# Patient Record
Sex: Female | Born: 1970 | Race: White | Hispanic: No | Marital: Married | Smoking: Never smoker
Health system: Southern US, Community
[De-identification: ages and names within clinical notes are randomized; demographics above are authoritative.]

## PROBLEM LIST (undated history)

## (undated) DIAGNOSIS — F419 Anxiety disorder, unspecified: Secondary | ICD-10-CM

## (undated) DIAGNOSIS — F32A Depression, unspecified: Secondary | ICD-10-CM

## (undated) DIAGNOSIS — G43909 Migraine, unspecified, not intractable, without status migrainosus: Secondary | ICD-10-CM

## (undated) DIAGNOSIS — F329 Major depressive disorder, single episode, unspecified: Secondary | ICD-10-CM

## (undated) DIAGNOSIS — G43109 Migraine with aura, not intractable, without status migrainosus: Secondary | ICD-10-CM

## (undated) HISTORY — DX: Anxiety disorder, unspecified: F41.9

## (undated) HISTORY — DX: Migraine with aura, not intractable, without status migrainosus: G43.109

## (undated) HISTORY — DX: Migraine, unspecified, not intractable, without status migrainosus: G43.909

## (undated) HISTORY — DX: Major depressive disorder, single episode, unspecified: F32.9

## (undated) HISTORY — DX: Depression, unspecified: F32.A

---

## 2005-08-25 ENCOUNTER — Inpatient Hospital Stay: Payer: Self-pay | Admitting: Obstetrics and Gynecology

## 2013-08-02 ENCOUNTER — Emergency Department: Payer: Self-pay | Admitting: Emergency Medicine

## 2013-08-02 LAB — CBC WITH DIFFERENTIAL/PLATELET
BASOS ABS: 0 10*3/uL (ref 0.0–0.1)
Basophil %: 0.2 %
EOS ABS: 0.1 10*3/uL (ref 0.0–0.7)
EOS PCT: 1 %
HCT: 42.8 % (ref 35.0–47.0)
HGB: 14.5 g/dL (ref 12.0–16.0)
Lymphocyte #: 1.1 10*3/uL (ref 1.0–3.6)
Lymphocyte %: 9.6 %
MCH: 29.8 pg (ref 26.0–34.0)
MCHC: 33.9 g/dL (ref 32.0–36.0)
MCV: 88 fL (ref 80–100)
MONO ABS: 0.4 x10 3/mm (ref 0.2–0.9)
Monocyte %: 3.3 %
NEUTROS ABS: 10 10*3/uL — AB (ref 1.4–6.5)
NEUTROS PCT: 85.9 %
PLATELETS: 236 10*3/uL (ref 150–440)
RBC: 4.87 10*6/uL (ref 3.80–5.20)
RDW: 13 % (ref 11.5–14.5)
WBC: 11.6 10*3/uL — AB (ref 3.6–11.0)

## 2013-08-02 LAB — URINALYSIS, COMPLETE
Bacteria: NONE SEEN
Bilirubin,UR: NEGATIVE
Blood: NEGATIVE
Glucose,UR: NEGATIVE mg/dL (ref 0–75)
LEUKOCYTE ESTERASE: NEGATIVE
NITRITE: NEGATIVE
PH: 7 (ref 4.5–8.0)
Protein: NEGATIVE
RBC,UR: 1 /HPF (ref 0–5)
Specific Gravity: 1.012 (ref 1.003–1.030)
Squamous Epithelial: 1
WBC UR: 2 /HPF (ref 0–5)

## 2013-08-02 LAB — COMPREHENSIVE METABOLIC PANEL
Albumin: 3.6 g/dL (ref 3.4–5.0)
Alkaline Phosphatase: 47 U/L
Anion Gap: 6 — ABNORMAL LOW (ref 7–16)
BUN: 15 mg/dL (ref 7–18)
Bilirubin,Total: 0.3 mg/dL (ref 0.2–1.0)
CHLORIDE: 106 mmol/L (ref 98–107)
CREATININE: 0.91 mg/dL (ref 0.60–1.30)
Calcium, Total: 8.5 mg/dL (ref 8.5–10.1)
Co2: 27 mmol/L (ref 21–32)
EGFR (African American): >60
EGFR (Non-African Amer.): >60
Glucose: 89 mg/dL (ref 65–99)
Osmolality: 278 (ref 275–301)
Potassium: 3.8 mmol/L (ref 3.5–5.1)
SGOT(AST): 30 U/L (ref 15–37)
SGPT (ALT): 19 U/L (ref 12–78)
Sodium: 139 mmol/L (ref 136–145)
TOTAL PROTEIN: 7.2 g/dL (ref 6.4–8.2)

## 2013-08-02 LAB — TROPONIN I: Troponin-I: <0.02 ng/mL

## 2013-08-02 LAB — CK TOTAL AND CKMB (NOT AT ARMC)
CK, Total: 112 U/L
CK-MB: 1 ng/mL (ref 0.5–3.6)

## 2013-08-02 LAB — LIPASE, BLOOD: Lipase: 169 U/L (ref 73–393)

## 2014-12-30 DIAGNOSIS — G43909 Migraine, unspecified, not intractable, without status migrainosus: Secondary | ICD-10-CM | POA: Insufficient documentation

## 2014-12-30 DIAGNOSIS — G43109 Migraine with aura, not intractable, without status migrainosus: Secondary | ICD-10-CM

## 2014-12-31 ENCOUNTER — Ambulatory Visit (INDEPENDENT_AMBULATORY_CARE_PROVIDER_SITE_OTHER): Payer: BC Managed Care – PPO | Admitting: Family Medicine

## 2014-12-31 ENCOUNTER — Encounter: Payer: Self-pay | Admitting: Family Medicine

## 2014-12-31 VITALS — BP 115/75 | HR 98 | Temp 99.0°F | Ht 64.2 in | Wt 144.0 lb

## 2014-12-31 DIAGNOSIS — G43109 Migraine with aura, not intractable, without status migrainosus: Secondary | ICD-10-CM | POA: Diagnosis not present

## 2014-12-31 DIAGNOSIS — R059 Cough, unspecified: Secondary | ICD-10-CM

## 2014-12-31 DIAGNOSIS — R05 Cough: Secondary | ICD-10-CM | POA: Diagnosis not present

## 2014-12-31 MED ORDER — SUMATRIPTAN SUCCINATE 100 MG PO TABS: 100.0000 mg | ORAL_TABLET | ORAL | Status: DC | PRN

## 2014-12-31 MED ORDER — BENZONATATE 200 MG PO CAPS: 200.0000 mg | ORAL_CAPSULE | Freq: Three times a day (TID) | ORAL | Status: DC | PRN

## 2014-12-31 MED ORDER — METAXALONE 800 MG PO TABS: 800.0000 mg | ORAL_TABLET | Freq: Three times a day (TID) | ORAL | Status: DC

## 2014-12-31 NOTE — Assessment & Plan Note (Signed)
Only once every couple of months. Does not need preventative medicine. Refill of imitrex given. Skelaxin for tension headaches. Call if not getting better or getting worse. Will return in a couple of months for a PE.

## 2014-12-31 NOTE — Progress Notes (Signed)
BP 115/75 mmHg  Pulse 98  Temp(Src) 99 F (37.2 C)  Ht 5' 4.2" (1.631 m)  Wt 144 lb (65.318 kg)  BMI 24.55 kg/m2  SpO2 99%  LMP 12/10/2014 (Approximate)   Subjective:    Patient ID: Jacqueline Salinas, female    DOB: [birth year], 44 y.o.   MRN: [id number]  HPI: Jacqueline Salinas is a 44 y.o. female  Chief Complaint  Patient presents with  . Migraine  . Cough    Patient would like something for a cough   MIGRAINES Duration: chronic 1 every 2 months.  Onset: sudden Severity: severe Quality: sharp Frequency: occasional Location: forehead and behind her eyes, also has tension headaches that usually do well with muscle relaxers Headache duration: day or 2 Radiation: no Time of day headache occurs: at random Alleviating factors:  Aggravating factors:  Headache status at time of visit: asymptomatic Treatments attempted: Treatments attempted: rest, ice, heat, APAP, ibuprofen, aleve", excedrine and triptans   Aura: yes Nausea:  yes Vomiting: yes Photophobia:  yes Phonophobia:  yes Effect on social functioning:  yes Confusion:  no Gait disturbance/ataxia:  no Behavioral changes:  no Fevers:  no   UPPER RESPIRATORY TRACT INFECTION Worst symptom: cough Fever: no Cough: yes Shortness of breath: no Wheezing: no Chest pain: no Chest tightness: no Chest congestion: no Nasal congestion: no Runny nose: yes Post nasal drip: yes Sneezing: no Sore throat: yes Swollen glands: no Sinus pressure: no Headache: no Face pain: no Toothache: no Ear pain: no  Ear pressure: yes  Eyes red/itching:no Eye drainage/crusting: no  Vomiting: no Rash: no Fatigue: yes Sick contacts: yes Strep contacts: no  Context: stable Recurrent sinusitis: no Relief with OTC cold/cough medications: no  Treatments attempted: cough syrup   Relevant past medical, surgical, family and social history reviewed and updated as indicated. Interim medical history since our last visit reviewed. Allergies and  medications reviewed and updated.  Review of Systems  Constitutional: Negative.   HENT: Negative.   Respiratory: Negative.   Cardiovascular: Negative.   Psychiatric/Behavioral: Negative.     Per HPI unless specifically indicated above     Objective:    BP 115/75 mmHg  Pulse 98  Temp(Src) 99 F (37.2 C)  Ht 5' 4.2" (1.631 m)  Wt 144 lb (65.318 kg)  BMI 24.55 kg/m2  SpO2 99%  LMP 12/10/2014 (Approximate)  Wt Readings from Last 3 Encounters:  12/31/14 144 lb (65.318 kg)  10/27/12 141 lb (63.957 kg)    Physical Exam  Constitutional: She is oriented to person, place, and time. She appears well-developed and well-nourished. No distress.  HENT:  Head: Normocephalic and atraumatic.  Right Ear: Hearing and external ear normal.  Left Ear: Hearing and external ear normal.  Nose: Nose normal.  Mouth/Throat: Oropharynx is clear and moist. No oropharyngeal exudate.  Eyes: Conjunctivae, EOM and lids are normal. Pupils are equal, round, and reactive to light. Right eye exhibits no discharge. Left eye exhibits no discharge. No scleral icterus.  Neck: Normal range of motion. Neck supple. No JVD present. No tracheal deviation present. No thyromegaly present.  Cardiovascular: Normal rate, regular rhythm, normal heart sounds and intact distal pulses.  Exam reveals no gallop and no friction rub.   No murmur heard. Pulmonary/Chest: Effort normal and breath sounds normal. No stridor. No respiratory distress. She has no wheezes. She has no rales. She exhibits no tenderness.  Musculoskeletal: Normal range of motion.  Lymphadenopathy:    She has no cervical adenopathy.  Neurological:  She is alert and oriented to person, place, and time.  Skin: Skin is warm, dry and intact. No rash noted. She is not diaphoretic. No erythema. No pallor.  Psychiatric: She has a normal mood and affect. Her speech is normal and behavior is normal. Judgment and thought content normal. Cognition and memory are normal.   Nursing note and vitals reviewed.   Results for orders placed or performed in visit on 08/02/13  CBC with Differential/Platelet  Result Value Ref Range   WBC 11.6 (H) 3.6-11.0 x10 3/mm 3   RBC 4.87 3.80-5.20 X10 6/mm 3   HGB 14.5 12.0-16.0 g/dL   HCT 42.8 35.0-47.0 %   MCV 88 80-100 fL   MCH 29.8 26.0-34.0 pg   MCHC 33.9 32.0-36.0 g/dL   RDW 13.0 11.5-14.5 %   Platelet 236 150-440 x10 3/mm 3   Neutrophil % 85.9 %   Lymphocyte % 9.6 %   Monocyte % 3.3 %   Eosinophil % 1.0 %   Basophil % 0.2 %   Neutrophil # 10.0 (H) 1.4-6.5 x10 3/mm 3   Lymphocyte # 1.1 1.0-3.6 x10 3/mm 3   Monocyte # 0.4 0.2-0.9 x10 3/mm    Eosinophil # 0.1 0.0-0.7 x10 3/mm 3   Basophil # 0.0 0.0-0.1 x10 3/mm 3  Comprehensive metabolic panel  Result Value Ref Range   Glucose 89 65-99 mg/dL   BUN 15 7-18 mg/dL   Creatinine 0.91 0.60-1.30 mg/dL   Sodium 139 136-145 mmol/L   Potassium 3.8 3.5-5.1 mmol/L   Chloride 106 98-107 mmol/L   Co2 27 21-32 mmol/L   Calcium, Total 8.5 8.5-10.1 mg/dL   SGOT(AST) 30 15-37 Unit/L   SGPT (ALT) 19 12-78 U/L   Alkaline Phosphatase 47 Unit/L   Albumin 3.6 3.4-5.0 g/dL   Total Protein 7.2 6.4-8.2 g/dL   Bilirubin,Total 0.3 0.2-1.0 mg/dL   Osmolality 278 275-301   Anion Gap 6 (L) 7-16   EGFR (African American) >60    EGFR (Non-African Amer.) >60   Lipase, blood  Result Value Ref Range   Lipase 169 73-393 Unit/L  Troponin I  Result Value Ref Range   Troponin-I < 0.02 ng/mL  CK total and CKMB (cardiac)  Result Value Ref Range   CK, Total 112 Unit/L   CK-MB 1.0 0.5-3.6 ng/mL  Urinalysis, Complete  Result Value Ref Range   Color - urine Yellow    Clarity - urine Clear    Glucose,UR Negative 0-75 mg/dL   Bilirubin,UR Negative NEGATIVE   Ketone Trace NEGATIVE   Specific Gravity 1.012 1.003-1.030   Blood Negative NEGATIVE   Ph 7.0 4.5-8.0   Protein Negative NEGATIVE   Nitrite Negative NEGATIVE   Leukocyte Esterase Negative NEGATIVE   RBC,UR 1 /HPF 0-5 /HPF    WBC UR 2 /HPF 0-5 /HPF   Bacteria NONE SEEN NONE SEEN   Squamous Epithelial 1 /HPF    Mucous PRESENT       Assessment & Plan:   Problem List Items Addressed This Visit      Cardiovascular and Mediastinum   Migraine - Primary    Only once every couple of months. Does not need preventative medicine. Refill of imitrex given. Skelaxin for tension headaches. Call if not getting better or getting worse. Will return in a couple of months for a PE.       Relevant Medications   clonazePAM (KLONOPIN) 0.5 MG tablet   buPROPion (WELLBUTRIN SR) 150 MG 12 hr tablet   SUMAtriptan (IMITREX) 100 MG tablet  metaxalone (SKELAXIN) 800 MG tablet    Other Visit Diagnoses    Cough        Likely post viral. Will treat with tessalon perles. Call if not getting better or getting worse.         Follow up plan: Return after the 1st of the year for physical.

## 2015-03-25 ENCOUNTER — Encounter: Payer: BC Managed Care – PPO | Admitting: Family Medicine

## 2015-11-06 IMAGING — CT CT ABD-PELV W/ CM
2 of 5 series · 16 of 46 positions shown, 18 images · IV contrast (agent unspecified)
Comparison: None.

CLINICAL DATA: Nauseated, vomiting

EXAM:
CT ABDOMEN AND PELVIS WITH CONTRAST
TECHNIQUE: Multidetector CT imaging of the abdomen and pelvis was performed
using the standard protocol following bolus administration of
intravenous contrast.
CONTRAST:  100 mL [id number]

[Series 2: routine abd pel with · axial · 0.62mm/px · z∈[-956,-560]mm · 13 of 89 slices shown, 15 images]
[im 5/89  soft-tissue]
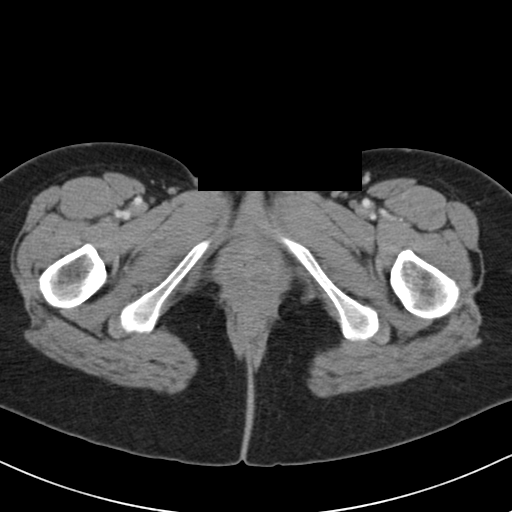
[im 5/89  bone]
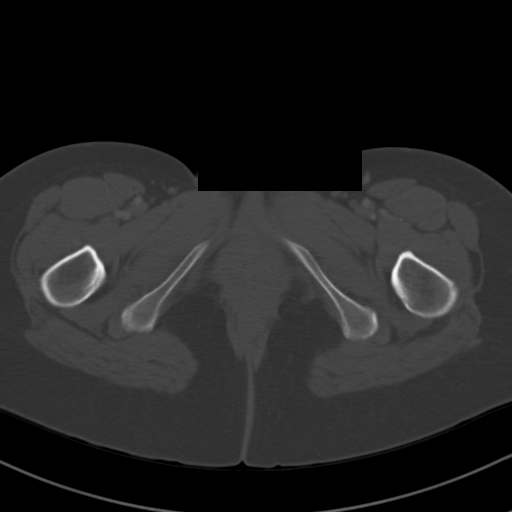
[im 10/89  soft-tissue]
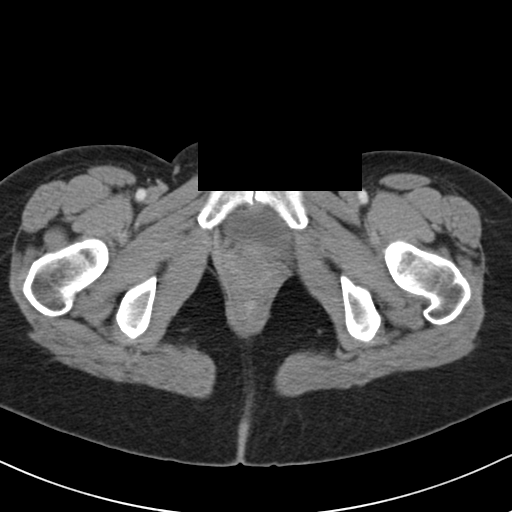
[im 20/89  soft-tissue]
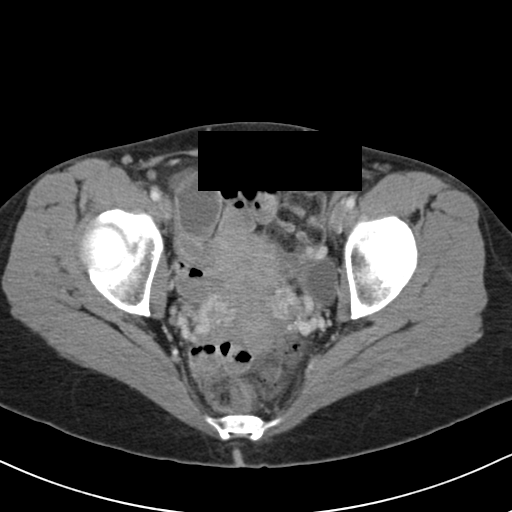
[im 25/89  soft-tissue]
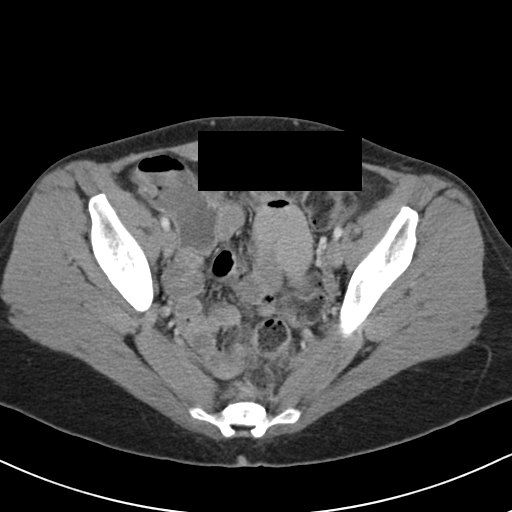
[im 30/89  soft-tissue]
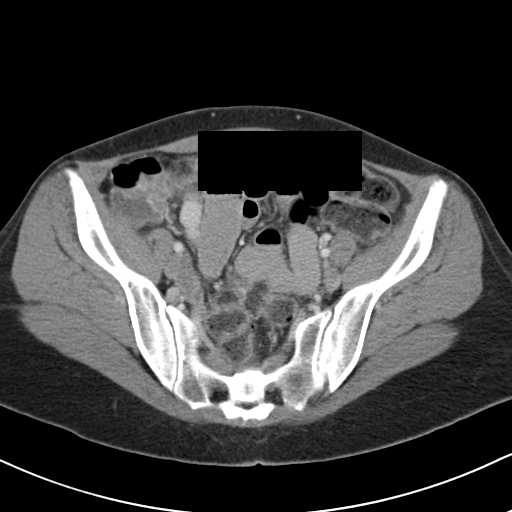
[im 40/89  soft-tissue]
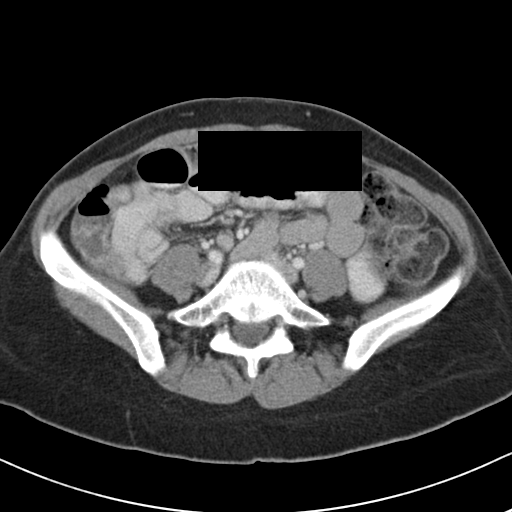
[im 45/89  soft-tissue]
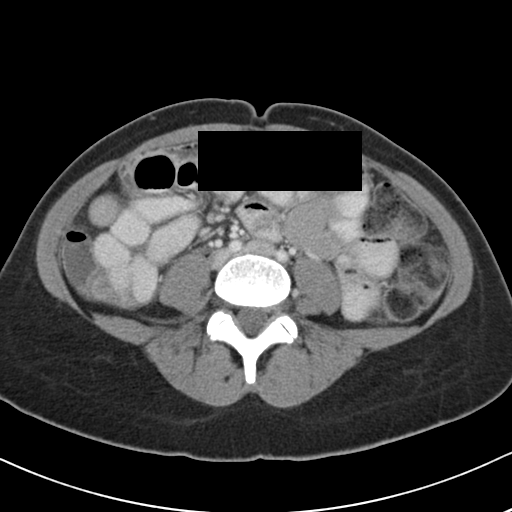
[im 49/89  soft-tissue]
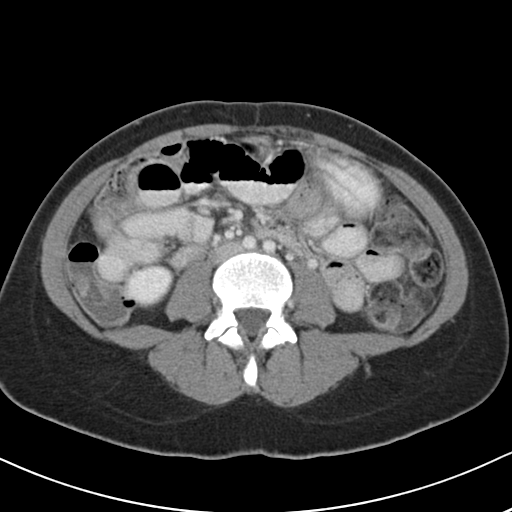
[im 59/89  soft-tissue]
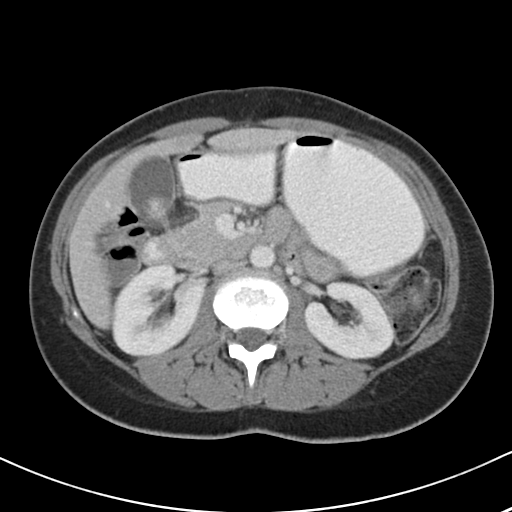
[im 59/89  bone]
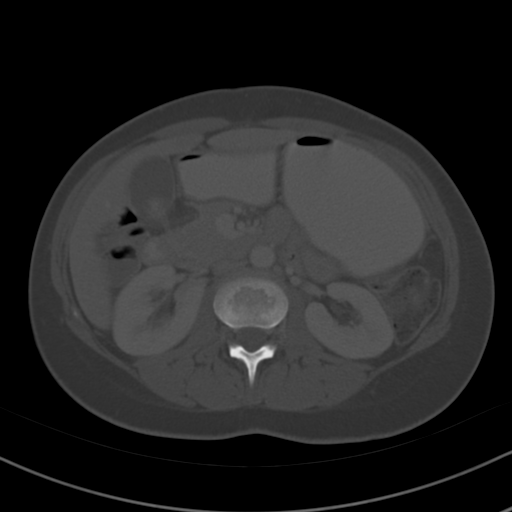
[im 64/89  soft-tissue]
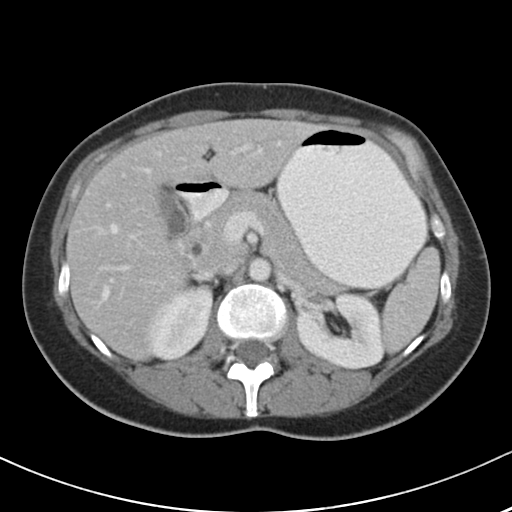
[im 69/89  soft-tissue]
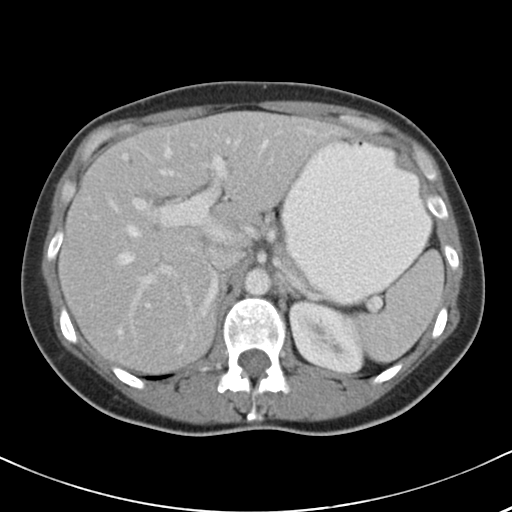
[im 79/89  soft-tissue]
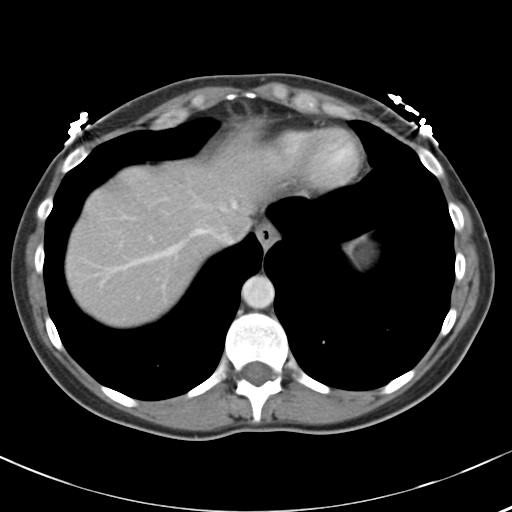
[im 84/89  soft-tissue]
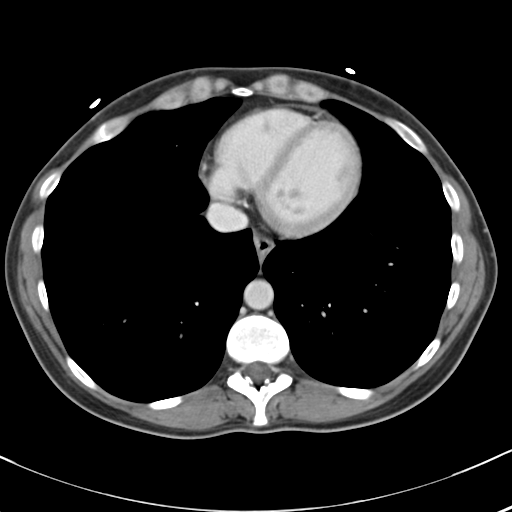

[Series 5: cor routine abd pel with · coronal · 0.65mm/px · 3 of 119 slices shown]
[im 40/119  soft-tissue]
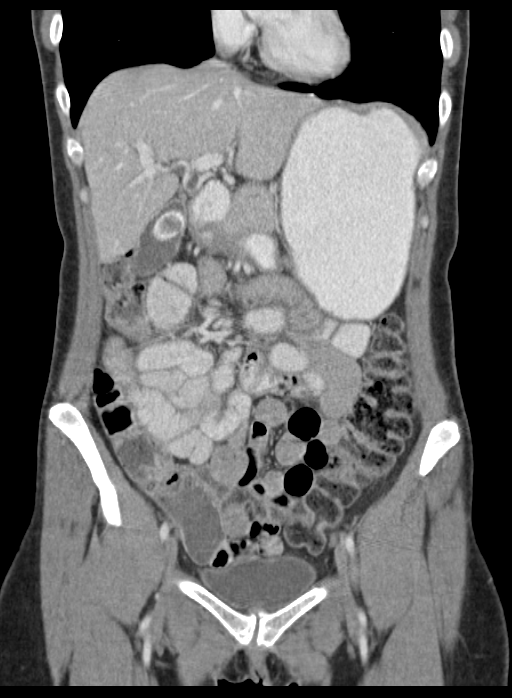
[im 53/119  soft-tissue]
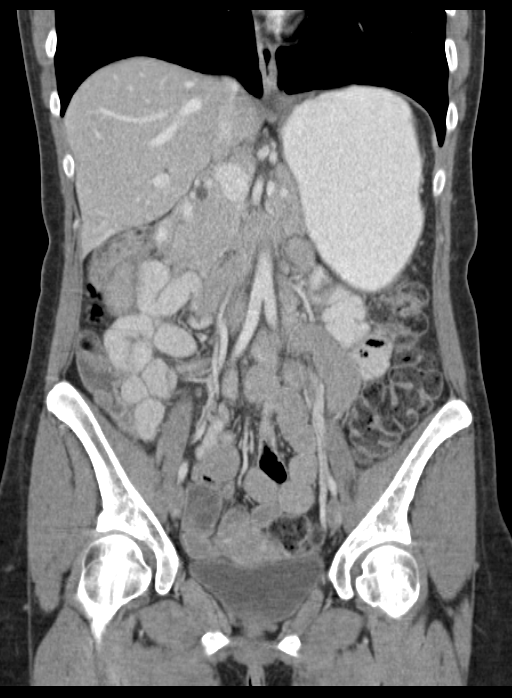
[im 66/119  soft-tissue]
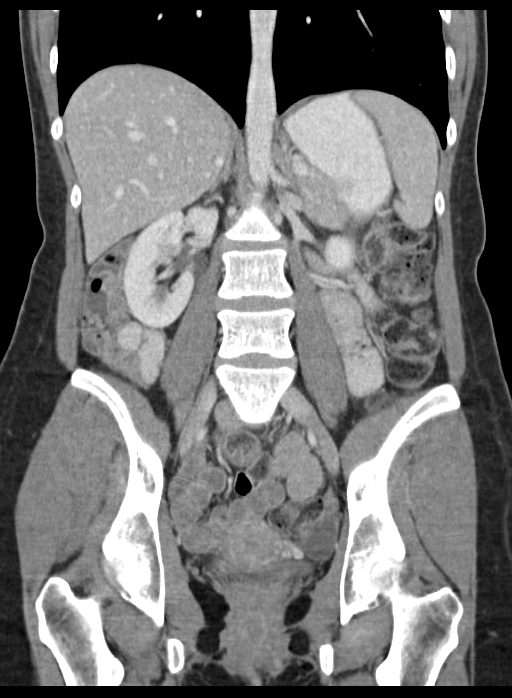

[16 of 46 positions shown; findings below may reference images not displayed]

FINDINGS: The lung bases are clear.

The liver demonstrates no focal abnormality. There is no
intrahepatic or extrahepatic biliary ductal dilatation. Large
gallstone in the gallbladder. The spleen demonstrates no focal
abnormality. The kidneys, adrenal glands and pancreas are normal.
The bladder is unremarkable.

The majority of the contrast is present within the stomach. The
stomach, duodenum, small intestine, and large intestine demonstrate
no contrast extravasation or dilatation. There is no
pneumoperitoneum, pneumatosis, or portal venous gas. There is no
abdominal or pelvic free fluid. There is no lymphadenopathy.

The abdominal aorta is normal in caliber .

There are no lytic or sclerotic osseous lesions.
IMPRESSION: 1. Large gallstone in the gallbladder.
2. A majority contrast is present within the stomach with multiple
air-fluid levels in the stomach and small bowel. There is no bowel
dilatation to suggest obstruction. This appearance can be seen with
gastroenteritis.

## 2015-11-06 IMAGING — CT CT HEAD WITHOUT CONTRAST
1 series · 16 of 30 positions shown, 20 images · non-contrast
Comparison: None.

CLINICAL DATA: Nausea.  Found on floor.  Confusion.

EXAM:
CT HEAD WITHOUT CONTRAST
TECHNIQUE: Contiguous axial images were obtained from the base of the skull
through the vertex without intravenous contrast.

[Series 2: head wo · axial · 0.41mm/px · z∈[-87,+39]mm · 16 of 32 slices shown, 20 images]
[im 2/32  brain]
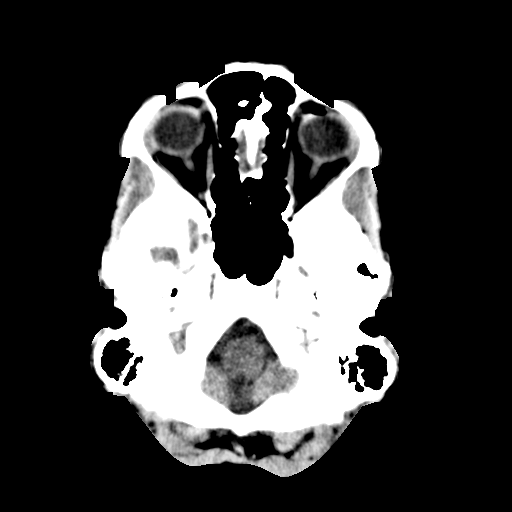
[im 2/32  bone]
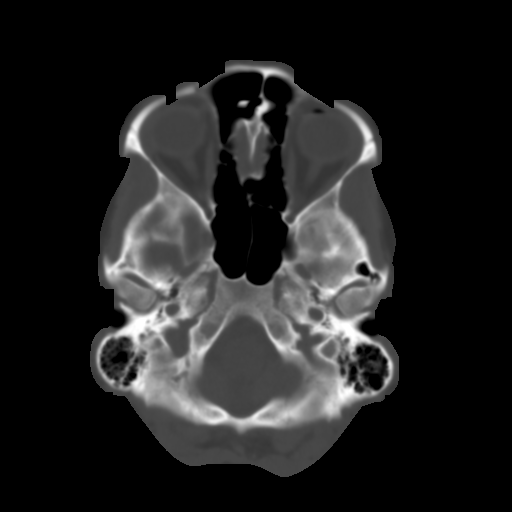
[im 4/32  brain]
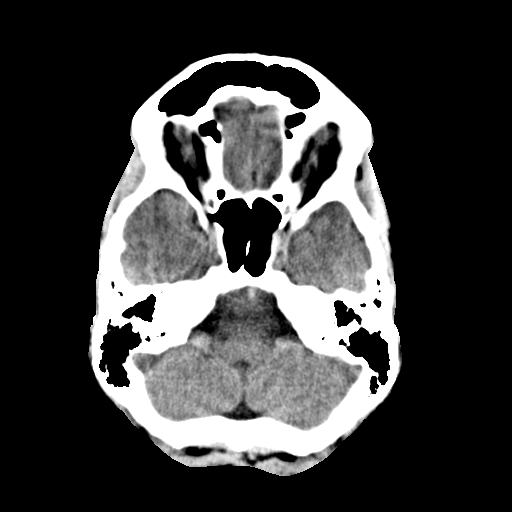
[im 6/32  brain]
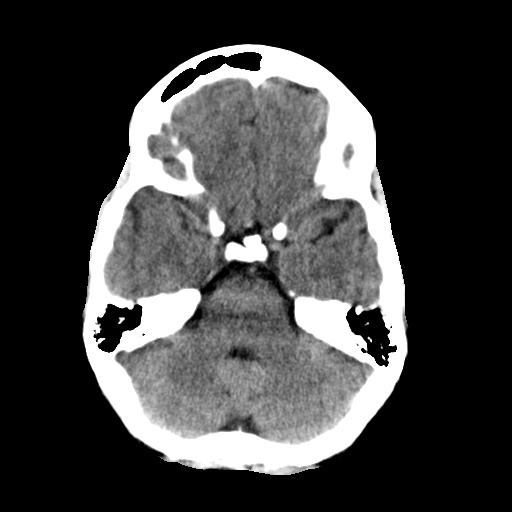
[im 8/32  brain]
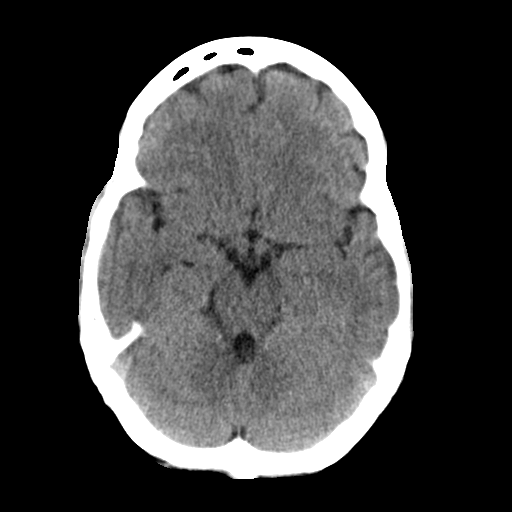
[im 9/32  brain]
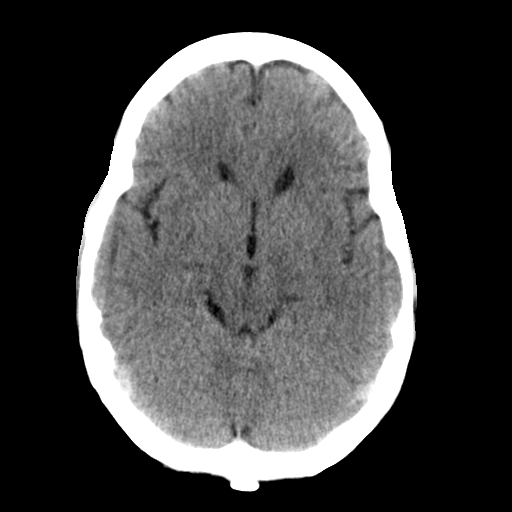
[im 9/32  bone]
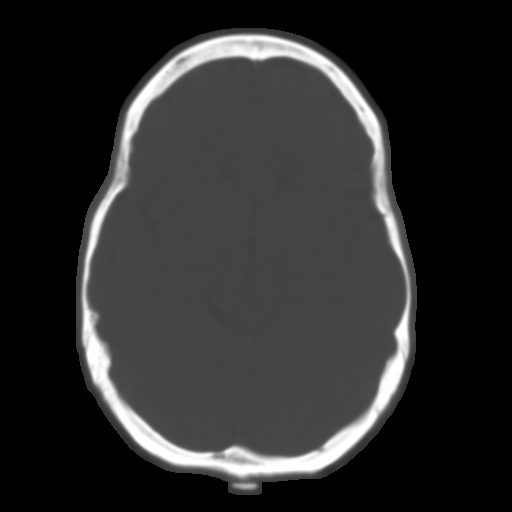
[im 11/32  brain]
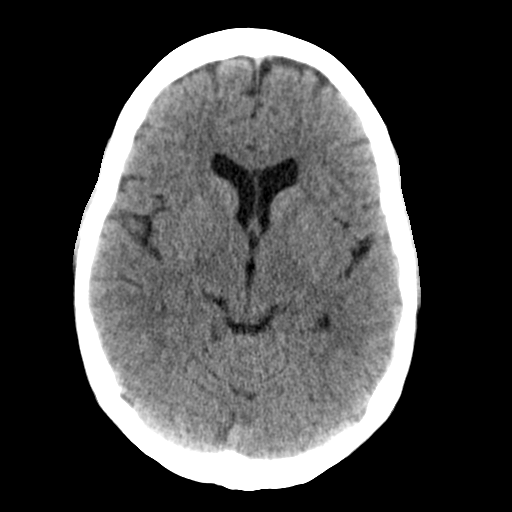
[im 13/32  brain]
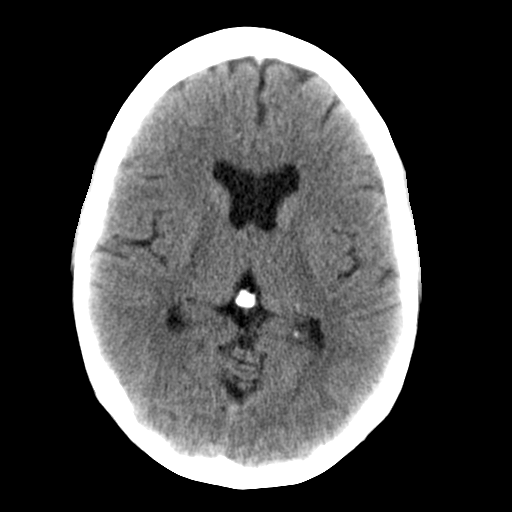
[im 15/32  brain]
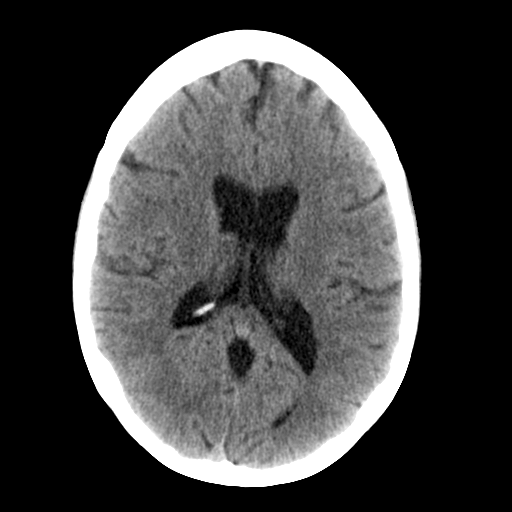
[im 17/32  brain]
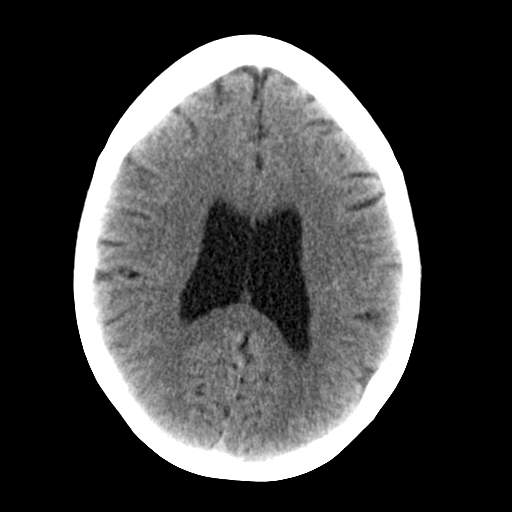
[im 17/32  bone]
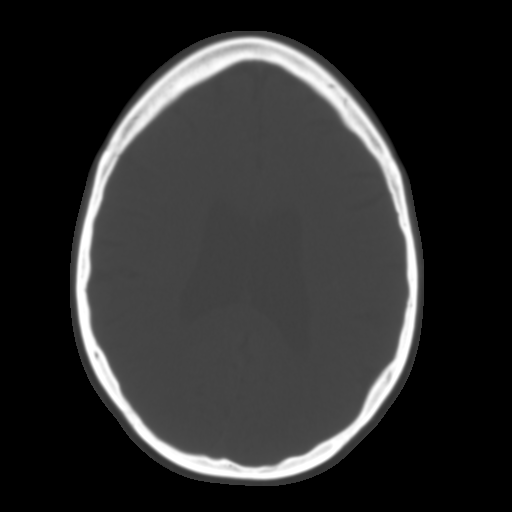
[im 19/32  brain]
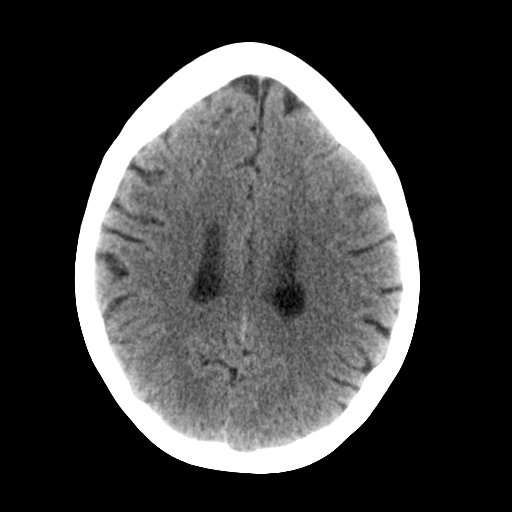
[im 21/32  brain]
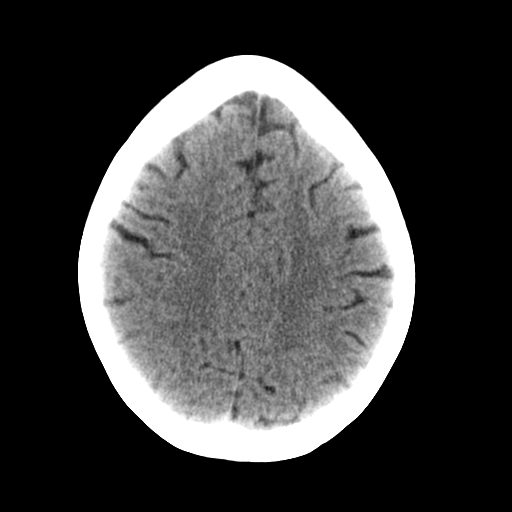
[im 23/32  brain]
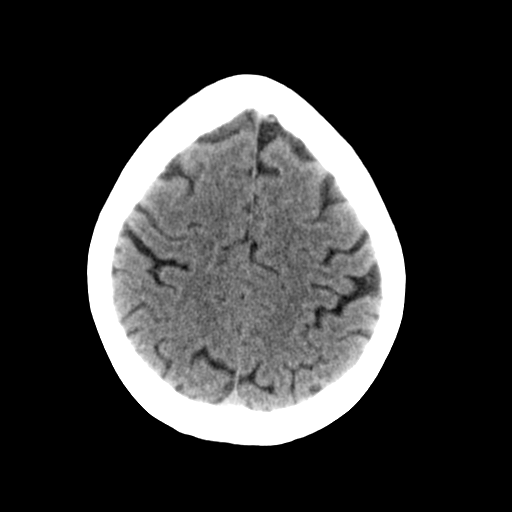
[im 24/32  brain]
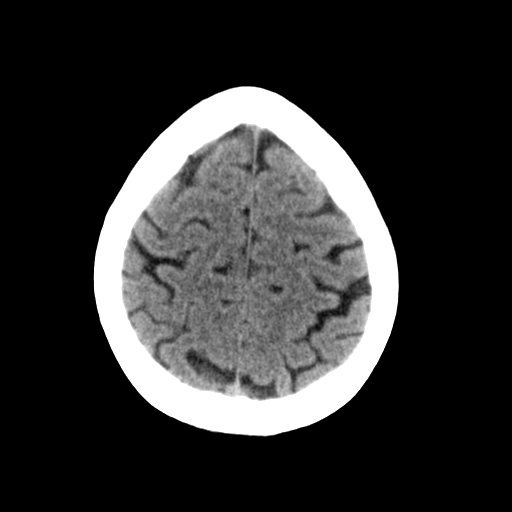
[im 24/32  bone]
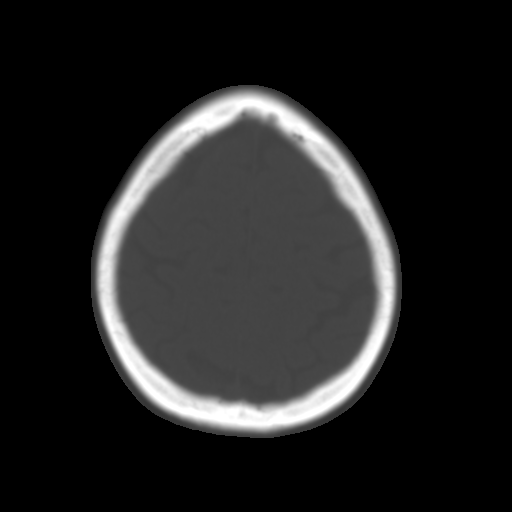
[im 26/32  brain]
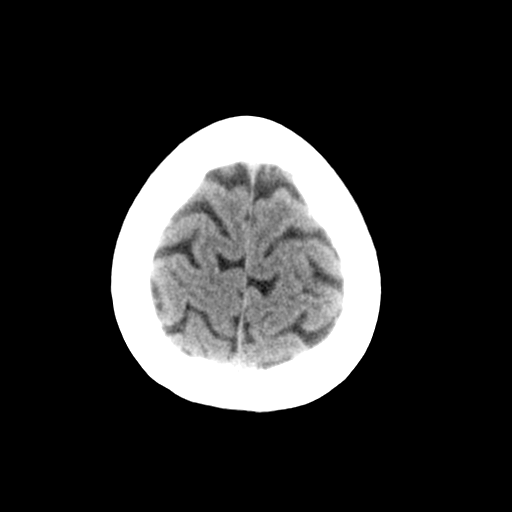
[im 28/32  brain]
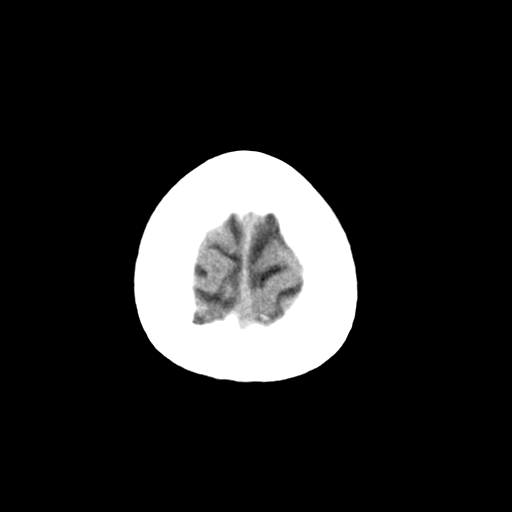
[im 30/32  brain]
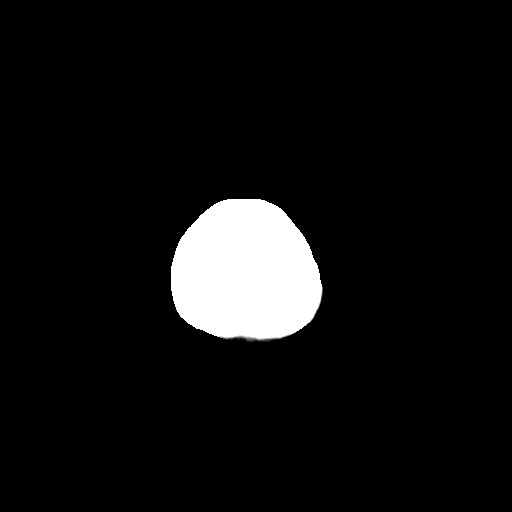

[16 of 30 positions shown; findings below may reference images not displayed]

FINDINGS: There is no evidence of acute infarction, mass lesion, or intra- or
extra-axial hemorrhage on CT.

The posterior fossa, including the cerebellum, brainstem and fourth
ventricle, is within normal limits. The third and lateral
ventricles, and basal ganglia are unremarkable in appearance. The
cerebral hemispheres are symmetric in appearance, with normal
gray-white differentiation. No mass effect or midline shift is seen.

There is no evidence of fracture; visualized osseous structures are
unremarkable in appearance. The visualized portions of the orbits
are within normal limits. The paranasal sinuses and mastoid air
cells are well-aerated. No significant soft tissue abnormalities are
seen.
IMPRESSION: No evidence of traumatic intracranial injury or fracture.

## 2017-12-22 ENCOUNTER — Ambulatory Visit (INDEPENDENT_AMBULATORY_CARE_PROVIDER_SITE_OTHER): Payer: BC Managed Care – PPO | Admitting: Family Medicine

## 2017-12-22 ENCOUNTER — Encounter: Payer: Self-pay | Admitting: Family Medicine

## 2017-12-22 VITALS — BP 122/76 | HR 93 | Temp 98.1°F | Ht 64.3 in | Wt 130.4 lb

## 2017-12-22 DIAGNOSIS — Z0000 Encounter for general adult medical examination without abnormal findings: Secondary | ICD-10-CM

## 2017-12-22 DIAGNOSIS — Z23 Encounter for immunization: Secondary | ICD-10-CM

## 2017-12-22 LAB — UA/M W/RFLX CULTURE, ROUTINE
Bilirubin, UA: NEGATIVE
Glucose, UA: NEGATIVE
Ketones, UA: NEGATIVE
Leukocytes, UA: NEGATIVE
NITRITE UA: NEGATIVE
PH UA: 6 (ref 5.0–7.5)
Protein, UA: NEGATIVE
RBC, UA: NEGATIVE
Specific Gravity, UA: <1.005 — ABNORMAL LOW (ref 1.005–1.030)
Urobilinogen, Ur: 0.2 mg/dL (ref 0.2–1.0)

## 2017-12-22 NOTE — Progress Notes (Signed)
BP 122/76 (BP Location: Left Arm, Cuff Size: Normal)   Pulse 93   Temp 98.1 F (36.7 C)   Ht 5' 4.3" (1.633 m)   Wt 130 lb 6 oz (59.1 kg)   SpO2 100%   BMI 22.17 kg/m    Subjective:    Patient ID: Jacqueline Salinas, female    DOB: [birth year], 47 y.o.   MRN: [id number]  HPI: Jacqueline Salinas is a 47 y.o. female presenting on 12/22/2017 for comprehensive medical examination. Current medical complaints include:none  Menopausal Symptoms: no  Depression Screen done today and results listed below:  Depression screen [facility] 2/9 12/22/2017  Decreased Interest 1  Down, Depressed, Hopeless 1  PHQ - 2 Score 2  Altered sleeping 0  Tired, decreased energy 2  Change in appetite 0  Feeling bad or failure about yourself  0  Trouble concentrating 0  Moving slowly or fidgety/restless 0  Suicidal thoughts 0  PHQ-9 Score 4  Difficult doing work/chores Not difficult at all     Past Medical History:  Past Medical History:  Diagnosis Date  . Anxiety   . Depression   . Migraine with acute onset aura   . Migraines     Surgical History:  History reviewed. No pertinent surgical history.  Medications:  Current Outpatient Medications on File Prior to Visit  Medication Sig  . amphetamine-dextroamphetamine (ADDERALL XR) 30 MG 24 hr capsule TK ONE C PO QAM  . amphetamine-dextroamphetamine (ADDERALL) 10 MG tablet TK 1 T PO QPM  . buPROPion (WELLBUTRIN SR) 150 MG 12 hr tablet TK 2 TS PO QAM  . clonazePAM (KLONOPIN) 0.5 MG tablet TK SS T PO BID PRN  . drospirenone-ethinyl estradiol (YAZ,GIANVI,LORYNA) 3-0.02 MG tablet Take 1 tablet by mouth daily.  . Vitamin D, Ergocalciferol, (DRISDOL) 50000 units CAPS capsule TK 1 C PO TWICE A MONTH   No current facility-administered medications on file prior to visit.     Allergies:  No Known Allergies  Social History:  Social History   Socioeconomic History  . Marital status: Married    Spouse name: Not on file  . Number of children: Not on file  .  Years of education: Not on file  . Highest education level: Not on file  Occupational History  . Not on file  Social Needs  . Financial resource strain: Not on file  . Food insecurity:    Worry: Not on file    Inability: Not on file  . Transportation needs:    Medical: Not on file    Non-medical: Not on file  Tobacco Use  . Smoking status: Never Smoker  . Smokeless tobacco: Never Used  Substance and Sexual Activity  . Alcohol use: Yes    Alcohol/week: 0.0 standard drinks    Comment: Rare  . Drug use: No  . Sexual activity: Yes    Birth control/protection: Pill  Lifestyle  . Physical activity:    Days per week: Not on file    Minutes per session: Not on file  . Stress: Not on file  Relationships  . Social connections:    Talks on phone: Not on file    Gets together: Not on file    Attends religious service: Not on file    Active member of club or organization: Not on file    Attends meetings of clubs or organizations: Not on file    Relationship status: Not on file  . Intimate partner violence:    Fear of  current or ex partner: Not on file    Emotionally abused: Not on file    Physically abused: Not on file    Forced sexual activity: Not on file  Other Topics Concern  . Not on file  Social History Narrative  . Not on file   Social History   Tobacco Use  Smoking Status Never Smoker  Smokeless Tobacco Never Used   Social History   Substance and Sexual Activity  Alcohol Use Yes  . Alcohol/week: 0.0 standard drinks   Comment: Rare    Family History:  Family History  Problem Relation Age of Onset  . Migraines Mother   . Dementia Maternal Grandmother   . Lung disease Maternal Grandfather   . Alzheimer's disease Paternal Grandfather     Past medical history, surgical history, medications, allergies, family history and social history reviewed with patient today and changes made to appropriate areas of the chart.   Review of Systems  Constitutional:  Positive for malaise/fatigue. Negative for chills, diaphoresis, fever and weight loss.  HENT: Negative.   Eyes: Negative.   Respiratory: Negative.   Cardiovascular: Negative.   Gastrointestinal: Negative.   Genitourinary: Negative.   Musculoskeletal: Negative.   Skin: Negative.   Neurological: Negative.   Endo/Heme/Allergies: Negative.   Psychiatric/Behavioral: Negative.     All other ROS negative except what is listed above and in the HPI.      Objective:    BP 122/76 (BP Location: Left Arm, Cuff Size: Normal)   Pulse 93   Temp 98.1 F (36.7 C)   Ht 5' 4.3" (1.633 m)   Wt 130 lb 6 oz (59.1 kg)   SpO2 100%   BMI 22.17 kg/m   Wt Readings from Last 3 Encounters:  12/22/17 130 lb 6 oz (59.1 kg)  12/31/14 144 lb (65.3 kg)  10/27/12 141 lb (64 kg)    Physical Exam  Constitutional: She is oriented to person, place, and time. She appears well-developed and well-nourished. No distress.  HENT:  Head: Normocephalic and atraumatic.  Right Ear: Hearing, tympanic membrane, external ear and ear canal normal.  Left Ear: Hearing, tympanic membrane, external ear and ear canal normal.  Nose: Nose normal.  Mouth/Throat: Uvula is midline, oropharynx is clear and moist and mucous membranes are normal. No oropharyngeal exudate.  Eyes: Pupils are equal, round, and reactive to light. Conjunctivae, EOM and lids are normal. Right eye exhibits no discharge. Left eye exhibits no discharge. No scleral icterus.  Neck: Normal range of motion. Neck supple. No JVD present. No tracheal deviation present. No thyromegaly present.  Cardiovascular: Normal rate, regular rhythm, normal heart sounds and intact distal pulses. Exam reveals no gallop and no friction rub.  No murmur heard. Pulmonary/Chest: Effort normal and breath sounds normal. No stridor. No respiratory distress. She has no wheezes. She has no rales. She exhibits no tenderness.  Abdominal: Soft. Bowel sounds are normal. She exhibits no distension  and no mass. There is no tenderness. There is no rebound and no guarding. No hernia.  Genitourinary:  Genitourinary Comments: Breast and pelvic exams deferred- done at GYN  Musculoskeletal: Normal range of motion. She exhibits no edema, tenderness or deformity.  Lymphadenopathy:    She has no cervical adenopathy.  Neurological: She is alert and oriented to person, place, and time. She displays normal reflexes. No cranial nerve deficit or sensory deficit. She exhibits normal muscle tone. Coordination normal.  Skin: Skin is warm, dry and intact. Capillary refill takes less than 2 seconds.  No rash noted. She is not diaphoretic. No erythema. No pallor.  Psychiatric: She has a normal mood and affect. Her speech is normal and behavior is normal. Judgment and thought content normal. Cognition and memory are normal.  Nursing note and vitals reviewed.   No results found for this or any previous visit.    Assessment & Plan:   Problem List Items Addressed This Visit    None    Visit Diagnoses    Routine general medical examination at a health care facility    -  Primary   Vaccines updated. Screening labs checked today. Pap and mammo through [facility]. Continue diet and exercise. Call with any concerns.    Relevant Orders   CBC with Differential/Platelet   Comprehensive metabolic panel   Lipid Panel w/o Chol/HDL Ratio   TSH   UA/M w/rflx Culture, Routine   HIV Antibody (routine testing w rflx)   Immunization due       Flu and Tdap given today   Relevant Orders   Tdap vaccine greater than or equal to 7yo IM   Flu Vaccine QUAD 6+ mos PF IM (Fluarix Quad PF) (Completed)       Follow up plan: Return in about 1 year (around 12/23/2018) for Physical- records release for [facility] please.   LABORATORY TESTING:  - Pap smear: done elsewhere  IMMUNIZATIONS:   - Tdap: Tetanus vaccination status reviewed: Given today. - Influenza: Administered today - Pneumovax: Not  applicable  SCREENING: -Mammogram: Ordered today   PATIENT COUNSELING:   Advised to take 1 mg of folate supplement per day if capable of pregnancy.   Sexuality: Discussed sexually transmitted diseases, partner selection, use of condoms, avoidance of unintended pregnancy  and contraceptive alternatives.   Advised to avoid cigarette smoking.  I discussed with the patient that most people either abstain from alcohol or drink within safe limits (<=14/week and <=4 drinks/occasion for males, <=7/weeks and <= 3 drinks/occasion for females) and that the risk for alcohol disorders and other health effects rises proportionally with the number of drinks per week and how often a drinker exceeds daily limits.  Discussed cessation/primary prevention of drug use and availability of treatment for abuse.   Diet: Encouraged to adjust caloric intake to maintain  or achieve ideal body weight, to reduce intake of dietary saturated fat and total fat, to limit sodium intake by avoiding high sodium foods and not adding table salt, and to maintain adequate dietary potassium and calcium preferably from fresh fruits, vegetables, and low-fat dairy products.    stressed the importance of regular exercise  Injury prevention: Discussed safety belts, safety helmets, smoke detector, smoking near bedding or upholstery.   Dental health: Discussed importance of regular tooth brushing, flossing, and dental visits.    NEXT PREVENTATIVE PHYSICAL DUE IN 1 YEAR. Return in about 1 year (around 12/23/2018) for Physical- records release for [facility] GYN please.

## 2017-12-22 NOTE — Patient Instructions (Signed)
Health Maintenance, Female Adopting a healthy lifestyle and getting preventive care can go a long way to promote health and wellness. Talk with your health care provider about what schedule of regular examinations is right for you. This is a good chance for you to check in with your provider about disease prevention and staying healthy. In between checkups, there are plenty of things you can do on your own. Experts have done a lot of research about which lifestyle changes and preventive measures are most likely to keep you healthy. Ask your health care provider for more information. Weight and diet Eat a healthy diet  Be sure to include plenty of vegetables, fruits, low-fat dairy products, and lean protein.  Do not eat a lot of foods high in solid fats, added sugars, or salt.  Get regular exercise. This is one of the most important things you can do for your health. ? Most adults should exercise for at least 150 minutes each week. The exercise should increase your heart rate and make you sweat (moderate-intensity exercise). ? Most adults should also do strengthening exercises at least twice a week. This is in addition to the moderate-intensity exercise.  Maintain a healthy weight  Body mass index (BMI) is a measurement that can be used to identify possible weight problems. It estimates body fat based on height and weight. Your health care provider can help determine your BMI and help you achieve or maintain a healthy weight.  For females 20 years of age and older: ? A BMI below 18.5 is considered underweight. ? A BMI of 18.5 to 24.9 is normal. ? A BMI of 25 to 29.9 is considered overweight. ? A BMI of 30 and above is considered obese.  Watch levels of cholesterol and blood lipids  You should start having your blood tested for lipids and cholesterol at 46 years of age, then have this test every 5 years.  You may need to have your cholesterol levels checked more often if: ? Your lipid or  cholesterol levels are high. ? You are older than 46 years of age. ? You are at high risk for heart disease.  Cancer screening Lung Cancer  Lung cancer screening is recommended for adults 55-80 years old who are at high risk for lung cancer because of a history of smoking.  A yearly low-dose CT scan of the lungs is recommended for people who: ? Currently smoke. ? Have quit within the past 15 years. ? Have at least a 30-pack-year history of smoking. A pack year is smoking an average of one pack of cigarettes a day for 1 year.  Yearly screening should continue until it has been 15 years since you quit.  Yearly screening should stop if you develop a health problem that would prevent you from having lung cancer treatment.  Breast Cancer  Practice breast self-awareness. This means understanding how your breasts normally appear and feel.  It also means doing regular breast self-exams. Let your health care provider know about any changes, no matter how small.  If you are in your 20s or 30s, you should have a clinical breast exam (CBE) by a health care provider every 1-3 years as part of a regular health exam.  If you are 40 or older, have a CBE every year. Also consider having a breast X-ray (mammogram) every year.  If you have a family history of breast cancer, talk to your health care provider about genetic screening.  If you are at high risk   for breast cancer, talk to your health care provider about having an MRI and a mammogram every year.  Breast cancer gene (BRCA) assessment is recommended for women who have family members with BRCA-related cancers. BRCA-related cancers include: ? Breast. ? Ovarian. ? Tubal. ? Peritoneal cancers.  Results of the assessment will determine the need for genetic counseling and BRCA1 and BRCA2 testing.  Cervical Cancer Your health care provider may recommend that you be screened regularly for cancer of the pelvic organs (ovaries, uterus, and  vagina). This screening involves a pelvic examination, including checking for microscopic changes to the surface of your cervix (Pap test). You may be encouraged to have this screening done every 3 years, beginning at age 22.  For women ages 56-65, health care providers may recommend pelvic exams and Pap testing every 3 years, or they may recommend the Pap and pelvic exam, combined with testing for human papilloma virus (HPV), every 5 years. Some types of HPV increase your risk of cervical cancer. Testing for HPV may also be done on women of any age with unclear Pap test results.  Other health care providers may not recommend any screening for nonpregnant women who are considered low risk for pelvic cancer and who do not have symptoms. Ask your health care provider if a screening pelvic exam is right for you.  If you have had past treatment for cervical cancer or a condition that could lead to cancer, you need Pap tests and screening for cancer for at least 20 years after your treatment. If Pap tests have been discontinued, your risk factors (such as having a new sexual partner) need to be reassessed to determine if screening should resume. Some women have medical problems that increase the chance of getting cervical cancer. In these cases, your health care provider may recommend more frequent screening and Pap tests.  Colorectal Cancer  This type of cancer can be detected and often prevented.  Routine colorectal cancer screening usually begins at 46 years of age and continues through 46 years of age.  Your health care provider may recommend screening at an earlier age if you have risk factors for colon cancer.  Your health care provider may also recommend using home test kits to check for hidden blood in the stool.  A small camera at the end of a tube can be used to examine your colon directly (sigmoidoscopy or colonoscopy). This is done to check for the earliest forms of colorectal  cancer.  Routine screening usually begins at age 33.  Direct examination of the colon should be repeated every 5-10 years through 46 years of age. However, you may need to be screened more often if early forms of precancerous polyps or small growths are found.  Skin Cancer  Check your skin from head to toe regularly.  Tell your health care provider about any new moles or changes in moles, especially if there is a change in a mole's shape or color.  Also tell your health care provider if you have a mole that is larger than the size of a pencil eraser.  Always use sunscreen. Apply sunscreen liberally and repeatedly throughout the day.  Protect yourself by wearing long sleeves, pants, a wide-brimmed hat, and sunglasses whenever you are outside.  Heart disease, diabetes, and high blood pressure  High blood pressure causes heart disease and increases the risk of stroke. High blood pressure is more likely to develop in: ? People who have blood pressure in the high end of  the normal range (130-139/85-89 mm Hg). ? People who are overweight or obese. ? People who are African American.  If you are 21-29 years of age, have your blood pressure checked every 3-5 years. If you are 3 years of age or older, have your blood pressure checked every year. You should have your blood pressure measured twice-once when you are at a hospital or clinic, and once when you are not at a hospital or clinic. Record the average of the two measurements. To check your blood pressure when you are not at a hospital or clinic, you can use: ? An automated blood pressure machine at a pharmacy. ? A home blood pressure monitor.  If you are between 17 years and 37 years old, ask your health care provider if you should take aspirin to prevent strokes.  Have regular diabetes screenings. This involves taking a blood sample to check your fasting blood sugar level. ? If you are at a normal weight and have a low risk for diabetes,  have this test once every three years after 46 years of age. ? If you are overweight and have a high risk for diabetes, consider being tested at a younger age or more often. Preventing infection Hepatitis B  If you have a higher risk for hepatitis B, you should be screened for this virus. You are considered at high risk for hepatitis B if: ? You were born in a country where hepatitis B is common. Ask your health care provider which countries are considered high risk. ? Your parents were born in a high-risk country, and you have not been immunized against hepatitis B (hepatitis B vaccine). ? You have HIV or AIDS. ? You use needles to inject street drugs. ? You live with someone who has hepatitis B. ? You have had sex with someone who has hepatitis B. ? You get hemodialysis treatment. ? You take certain medicines for conditions, including cancer, organ transplantation, and autoimmune conditions.  Hepatitis C  Blood testing is recommended for: ? Everyone born from [birth year] through 1964. ? Anyone with known risk factors for hepatitis C.  Sexually transmitted infections (STIs)  You should be screened for sexually transmitted infections (STIs) including gonorrhea and chlamydia if: ? You are sexually active and are younger than 46 years of age. ? You are older than 46 years of age and your health care provider tells you that you are at risk for this type of infection. ? Your sexual activity has changed since you were last screened and you are at an increased risk for chlamydia or gonorrhea. Ask your health care provider if you are at risk.  If you do not have HIV, but are at risk, it may be recommended that you take a prescription medicine daily to prevent HIV infection. This is called pre-exposure prophylaxis (PrEP). You are considered at risk if: ? You are sexually active and do not regularly use condoms or know the HIV status of your partner(s). ? You take drugs by injection. ? You are  sexually active with a partner who has HIV.  Talk with your health care provider about whether you are at high risk of being infected with HIV. If you choose to begin PrEP, you should first be tested for HIV. You should then be tested every 3 months for as long as you are taking PrEP. Pregnancy  If you are premenopausal and you may become pregnant, ask your health care provider about preconception counseling.  If you may become  pregnant, take 400 to 800 micrograms (mcg) of folic acid every day.  If you want to prevent pregnancy, talk to your health care provider about birth control (contraception). Osteoporosis and menopause  Osteoporosis is a disease in which the bones lose minerals and strength with aging. This can result in serious bone fractures. Your risk for osteoporosis can be identified using a bone density scan.  If you are 38 years of age or older, or if you are at risk for osteoporosis and fractures, ask your health care provider if you should be screened.  Ask your health care provider whether you should take a calcium or vitamin D supplement to lower your risk for osteoporosis.  Menopause may have certain physical symptoms and risks.  Hormone replacement therapy may reduce some of these symptoms and risks. Talk to your health care provider about whether hormone replacement therapy is right for you. Follow these instructions at home:  Schedule regular health, dental, and eye exams.  Stay current with your immunizations.  Do not use any tobacco products including cigarettes, chewing tobacco, or electronic cigarettes.  If you are pregnant, do not drink alcohol.  If you are breastfeeding, limit how much and how often you drink alcohol.  Limit alcohol intake to no more than 1 drink per day for nonpregnant women. One drink equals 12 ounces of beer, 5 ounces of wine, or 1 ounces of hard liquor.  Do not use street drugs.  Do not share needles.  Ask your health care  provider for help if you need support or information about quitting drugs.  Tell your health care provider if you often feel depressed.  Tell your health care provider if you have ever been abused or do not feel safe at home. This information is not intended to replace advice given to you by your health care provider. Make sure you discuss any questions you have with your health care provider. Document Released: 09/07/2010 Document Revised: 07/31/2015 Document Reviewed: 11/26/2014 Elsevier Interactive Patient Education  2018 [employer]. Influenza (Flu) Vaccine (Inactivated or Recombinant): What You Need to Know 1. Why get vaccinated? Influenza ("flu") is a contagious disease that spreads around the [place] every year, usually between October and May. Flu is caused by influenza viruses, and is spread mainly by coughing, sneezing, and close contact. Anyone can get flu. Flu strikes suddenly and can last several days. Symptoms vary by age, but can include:  fever/chills  sore throat  muscle aches  fatigue  cough  headache  runny or stuffy nose  Flu can also lead to pneumonia and blood infections, and cause diarrhea and seizures in children. If you have a medical condition, such as heart or lung disease, flu can make it worse. Flu is more dangerous for some people. Infants and young children, people 68 years of age and older, pregnant women, and people with certain health conditions or a weakened immune system are at greatest risk. Each year thousands of people in the [place] die from flu, and many more are hospitalized. Flu vaccine can:  keep you from getting flu,  make flu less severe if you do get it, and  keep you from spreading flu to your family and other people. 2. Inactivated and recombinant flu vaccines A dose of flu vaccine is recommended every flu season. Children 6 months through 16 years of age may need two doses during the same flu season. Everyone else  needs only one dose each flu season. Some inactivated flu vaccines  contain a very small amount of a mercury-based preservative called thimerosal. Studies have not shown thimerosal in vaccines to be harmful, but flu vaccines that do not contain thimerosal are available. There is no live flu virus in flu shots. They cannot cause the flu. There are many flu viruses, and they are always changing. Each year a new flu vaccine is made to protect against three or four viruses that are likely to cause disease in the upcoming flu season. But even when the vaccine doesn't exactly match these viruses, it may still provide some protection. Flu vaccine cannot prevent:  flu that is caused by a virus not covered by the vaccine, or  illnesses that look like flu but are not.  It takes about 2 weeks for protection to develop after vaccination, and protection lasts through the flu season. 3. Some people should not get this vaccine Tell the person who is giving you the vaccine:  If you have any severe, life-threatening allergies. If you ever had a life-threatening allergic reaction after a dose of flu vaccine, or have a severe allergy to any part of this vaccine, you may be advised not to get vaccinated. Most, but not all, types of flu vaccine contain a small amount of egg protein.  If you ever had Guillain-Barr Syndrome (also called GBS). Some people with a history of GBS should not get this vaccine. This should be discussed with your doctor.  If you are not feeling well. It is usually okay to get flu vaccine when you have a mild illness, but you might be asked to come back when you feel better.  4. Risks of a vaccine reaction With any medicine, including vaccines, there is a chance of reactions. These are usually mild and go away on their own, but serious reactions are also possible. Most people who get a flu shot do not have any problems with it. Minor problems following a flu shot include:  soreness,  redness, or swelling where the shot was given  hoarseness  sore, red or itchy eyes  cough  fever  aches  headache  itching  fatigue  If these problems occur, they usually begin soon after the shot and last 1 or 2 days. More serious problems following a flu shot can include the following:  There may be a small increased risk of Guillain-Barre Syndrome (GBS) after inactivated flu vaccine. This risk has been estimated at 1 or 2 additional cases per million people vaccinated. This is much lower than the risk of severe complications from flu, which can be prevented by flu vaccine.  Young children who get the flu shot along with pneumococcal vaccine (PCV13) and/or DTaP vaccine at the same time might be slightly more likely to have a seizure caused by fever. Ask your doctor for more information. Tell your doctor if a child who is getting flu vaccine has ever had a seizure.  Problems that could happen after any injected vaccine:  People sometimes faint after a medical procedure, including vaccination. Sitting or lying down for about 15 minutes can help prevent fainting, and injuries caused by a fall. Tell your doctor if you feel dizzy, or have vision changes or ringing in the ears.  Some people get severe pain in the shoulder and have difficulty moving the arm where a shot was given. This happens very rarely.  Any medication can cause a severe allergic reaction. Such reactions from a vaccine are very rare, estimated at about 1 in a million doses, and  would happen within a few minutes to a few hours after the vaccination. As with any medicine, there is a very remote chance of a vaccine causing a serious injury or death. The safety of vaccines is always being monitored. For more information, visit: http://www.aguilar.org/ 5. What if there is a serious reaction? What should I look for? Look for anything that concerns you, such as signs of a severe allergic reaction, very high fever, or  unusual behavior. Signs of a severe allergic reaction can include hives, swelling of the face and throat, difficulty breathing, a fast heartbeat, dizziness, and weakness. These would start a few minutes to a few hours after the vaccination. What should I do?  If you think it is a severe allergic reaction or other emergency that can't wait, call 9-1-1 and get the person to the nearest hospital. Otherwise, call your doctor.  Reactions should be reported to the Vaccine Adverse Event Reporting System (VAERS). Your doctor should file this report, or you can do it yourself through the VAERS web site at www.vaers.SamedayNews.es, or by calling 563-613-2618. ? VAERS does not give medical advice. 6. The National Vaccine Injury Compensation Program The [employer] Vaccine Injury Compensation Program (VICP) is a federal program that was created to compensate people who may have been injured by certain vaccines. Persons who believe they may have been injured by a vaccine can learn about the program and about filing a claim by calling 484-489-0860 or visiting the [employer] website at GoldCloset.com.ee. There is a time limit to file a claim for compensation. 7. How can I learn more?  Ask your healthcare provider. He or she can give you the vaccine package insert or suggest other sources of information.  Call your local or state health department.  Contact the Centers for Disease Control and Prevention (CDC): ? Call 4241386848 (1-800-CDC-INFO) or ? Visit CDC's website at https://gibson.com/ Vaccine Information Statement, Inactivated Influenza Vaccine (10/12/2013) This information is not intended to replace advice given to you by your health care provider. Make sure you discuss any questions you have with your health care provider. Document Released: 12/17/2005 Document Revised: 11/13/2015 Document Reviewed: 11/13/2015 Elsevier Interactive Patient Education  2017 [employer]. Tdap Vaccine (Tetanus,  Diphtheria and Pertussis): What You Need to Know 1. Why get vaccinated? Tetanus, diphtheria and pertussis are very serious diseases. Tdap vaccine can protect [place] from these diseases. And, Tdap vaccine given to pregnant women can protect newborn babies against pertussis. TETANUS (Lockjaw) is rare in the [place] today. It causes painful muscle tightening and stiffness, usually all over the body.  It can lead to tightening of muscles in the head and neck so you can't open your mouth, swallow, or sometimes even breathe. Tetanus kills about 1 out of 10 people who are infected even after receiving the best medical care.  DIPHTHERIA is also rare in the [place] today. It can cause a thick coating to form in the back of the throat.  It can lead to breathing problems, heart failure, paralysis, and death.  PERTUSSIS (Whooping Cough) causes severe coughing spells, which can cause difficulty breathing, vomiting and disturbed sleep.  It can also lead to weight loss, incontinence, and rib fractures. Up to 2 in 100 adolescents and 5 in 100 adults with pertussis are hospitalized or have complications, which could include pneumonia or death.  These diseases are caused by bacteria. Diphtheria and pertussis are spread from person to person through secretions from coughing or sneezing. Tetanus enters the body through cuts,  scratches, or wounds. Before vaccines, as many as 200,000 cases of diphtheria, 200,000 cases of pertussis, and hundreds of cases of tetanus, were reported in the [place] each year. Since vaccination began, reports of cases for tetanus and diphtheria have dropped by about 99% and for pertussis by about 80%. 2. Tdap vaccine Tdap vaccine can protect adolescents and adults from tetanus, diphtheria, and pertussis. One dose of Tdap is routinely given at age 66 or 62. People who did not get Tdap at that age should get it as soon as possible. Tdap is especially important for healthcare  professionals and anyone having close contact with a baby younger than 12 months. Pregnant women should get a dose of Tdap during every pregnancy, to protect the newborn from pertussis. Infants are most at risk for severe, life-threatening complications from pertussis. Another vaccine, called Td, protects against tetanus and diphtheria, but not pertussis. A Td booster should be given every 10 years. Tdap may be given as one of these boosters if you have never gotten Tdap before. Tdap may also be given after a severe cut or burn to prevent tetanus infection. Your doctor or the person giving you the vaccine can give you more information. Tdap may safely be given at the same time as other vaccines. 3. Some people should not get this vaccine  A person who has ever had a life-threatening allergic reaction after a previous dose of any diphtheria, tetanus or pertussis containing vaccine, OR has a severe allergy to any part of this vaccine, should not get Tdap vaccine. Tell the person giving the vaccine about any severe allergies.  Anyone who had coma or long repeated seizures within 7 days after a childhood dose of DTP or DTaP, or a previous dose of Tdap, should not get Tdap, unless a cause other than the vaccine was found. They can still get Td.  Talk to your doctor if you: ? have seizures or another nervous system problem, ? had severe pain or swelling after any vaccine containing diphtheria, tetanus or pertussis, ? ever had a condition called Guillain-Barr Syndrome (GBS), ? aren't feeling well on the day the shot is scheduled. 4. Risks With any medicine, including vaccines, there is a chance of side effects. These are usually mild and go away on their own. Serious reactions are also possible but are rare. Most people who get Tdap vaccine do not have any problems with it. Mild problems following Tdap: (Did not interfere with activities)  Pain where the shot was given (about 3 in 4 adolescents or 2  in 3 adults)  Redness or swelling where the shot was given (about 1 person in 5)  Mild fever of at least 100.64F (up to about 1 in 25 adolescents or 1 in 100 adults)  Headache (about 3 or 4 people in 10)  Tiredness (about 1 person in 3 or 4)  Nausea, vomiting, diarrhea, stomach ache (up to 1 in 4 adolescents or 1 in 10 adults)  Chills, sore joints (about 1 person in 10)  Body aches (about 1 person in 3 or 4)  Rash, swollen glands (uncommon)  Moderate problems following Tdap: (Interfered with activities, but did not require medical attention)  Pain where the shot was given (up to 1 in 5 or 6)  Redness or swelling where the shot was given (up to about 1 in 16 adolescents or 1 in 12 adults)  Fever over 102F (about 1 in 100 adolescents or 1 in 250 adults)  Headache (about  1 in 7 adolescents or 1 in 10 adults)  Nausea, vomiting, diarrhea, stomach ache (up to 1 or 3 people in 100)  Swelling of the entire arm where the shot was given (up to about 1 in 500).  Severe problems following Tdap: (Unable to perform usual activities; required medical attention)  Swelling, severe pain, bleeding and redness in the arm where the shot was given (rare).  Problems that could happen after any vaccine:  People sometimes faint after a medical procedure, including vaccination. Sitting or lying down for about 15 minutes can help prevent fainting, and injuries caused by a fall. Tell your doctor if you feel dizzy, or have vision changes or ringing in the ears.  Some people get severe pain in the shoulder and have difficulty moving the arm where a shot was given. This happens very rarely.  Any medication can cause a severe allergic reaction. Such reactions from a vaccine are very rare, estimated at fewer than 1 in a million doses, and would happen within a few minutes to a few hours after the vaccination. As with any medicine, there is a very remote chance of a vaccine causing a serious injury or  death. The safety of vaccines is always being monitored. For more information, visit: http://www.aguilar.org/ 5. What if there is a serious problem? What should I look for? Look for anything that concerns you, such as signs of a severe allergic reaction, very high fever, or unusual behavior. Signs of a severe allergic reaction can include hives, swelling of the face and throat, difficulty breathing, a fast heartbeat, dizziness, and weakness. These would usually start a few minutes to a few hours after the vaccination. What should I do?  If you think it is a severe allergic reaction or other emergency that can't wait, call 9-1-1 or get the person to the nearest hospital. Otherwise, call your doctor.  Afterward, the reaction should be reported to the Vaccine Adverse Event Reporting System (VAERS). Your doctor might file this report, or you can do it yourself through the VAERS web site at www.vaers.SamedayNews.es, or by calling 727-021-6909. ? VAERS does not give medical advice. 6. The National Vaccine Injury Compensation Program The [employer] Vaccine Injury Compensation Program (VICP) is a federal program that was created to compensate people who may have been injured by certain vaccines. Persons who believe they may have been injured by a vaccine can learn about the program and about filing a claim by calling 867-661-1777 or visiting the [facility] website at GoldCloset.com.ee. There is a time limit to file a claim for compensation. 7. How can I learn more?  Ask your doctor. He or she can give you the vaccine package insert or suggest other sources of information.  Call your local or state health department.  Contact the Centers for Disease Control and Prevention (CDC): ? Call (256)115-1009 (1-800-CDC-INFO) or ? Visit CDC's website at http://hunter.com/ CDC Tdap Vaccine VIS (05/01/13) This information is not intended to replace advice given to you by your health care provider. Make  sure you discuss any questions you have with your health care provider. Document Released: 08/24/2011 Document Revised: 11/13/2015 Document Reviewed: 11/13/2015 Elsevier Interactive Patient Education  2017 [employer].

## 2017-12-23 LAB — COMPREHENSIVE METABOLIC PANEL
A/G RATIO: 1.8 (ref 1.2–2.2)
ALBUMIN: 4.1 g/dL (ref 3.5–5.5)
ALT: 11 IU/L (ref 0–32)
AST: 17 IU/L (ref 0–40)
Alkaline Phosphatase: 43 IU/L (ref 39–117)
BUN / CREAT RATIO: 19 (ref 9–23)
BUN: 15 mg/dL (ref 6–24)
Bilirubin Total: <0.2 mg/dL (ref 0.0–1.2)
CALCIUM: 9 mg/dL (ref 8.7–10.2)
CO2: 22 mmol/L (ref 20–29)
Chloride: 100 mmol/L (ref 96–106)
Creatinine, Ser: 0.77 mg/dL (ref 0.57–1.00)
GFR, EST AFRICAN AMERICAN: 107 mL/min/{1.73_m2} (ref >59)
GFR, EST NON AFRICAN AMERICAN: 93 mL/min/{1.73_m2} (ref >59)
Globulin, Total: 2.3 g/dL (ref 1.5–4.5)
Glucose: 87 mg/dL (ref 65–99)
POTASSIUM: 4 mmol/L (ref 3.5–5.2)
SODIUM: 137 mmol/L (ref 134–144)
TOTAL PROTEIN: 6.4 g/dL (ref 6.0–8.5)

## 2017-12-23 LAB — LIPID PANEL W/O CHOL/HDL RATIO
CHOLESTEROL TOTAL: 204 mg/dL — AB (ref 100–199)
HDL: 97 mg/dL (ref >39)
LDL CALC: 92 mg/dL (ref 0–99)
Triglycerides: 77 mg/dL (ref 0–149)
VLDL CHOLESTEROL CAL: 15 mg/dL (ref 5–40)

## 2017-12-23 LAB — CBC WITH DIFFERENTIAL/PLATELET
Basophils Absolute: 0.1 10*3/uL (ref 0.0–0.2)
Basos: 1 %
EOS (ABSOLUTE): 0.1 10*3/uL (ref 0.0–0.4)
Eos: 2 %
HEMATOCRIT: 41.5 % (ref 34.0–46.6)
Hemoglobin: 14.1 g/dL (ref 11.1–15.9)
IMMATURE GRANULOCYTES: 0 %
Immature Grans (Abs): 0 10*3/uL (ref 0.0–0.1)
Lymphocytes Absolute: 3.2 10*3/uL — ABNORMAL HIGH (ref 0.7–3.1)
Lymphs: 37 %
MCH: 30.4 pg (ref 26.6–33.0)
MCHC: 34 g/dL (ref 31.5–35.7)
MCV: 89 fL (ref 79–97)
MONOS ABS: 0.6 10*3/uL (ref 0.1–0.9)
Monocytes: 7 %
NEUTROS PCT: 53 %
Neutrophils Absolute: 4.5 10*3/uL (ref 1.4–7.0)
Platelets: 359 10*3/uL (ref 150–450)
RBC: 4.64 x10E6/uL (ref 3.77–5.28)
RDW: 12.3 % (ref 12.3–15.4)
WBC: 8.5 10*3/uL (ref 3.4–10.8)

## 2017-12-23 LAB — HIV ANTIBODY (ROUTINE TESTING W REFLEX): HIV SCREEN 4TH GENERATION: NONREACTIVE

## 2017-12-23 LAB — TSH: TSH: 2.13 u[IU]/mL (ref 0.450–4.500)

## 2017-12-26 ENCOUNTER — Encounter: Payer: Self-pay | Admitting: Family Medicine

## 2018-03-02 ENCOUNTER — Ambulatory Visit: Payer: BC Managed Care – PPO | Admitting: Family Medicine

## 2018-03-02 ENCOUNTER — Other Ambulatory Visit: Payer: Self-pay

## 2018-03-02 ENCOUNTER — Encounter: Payer: Self-pay | Admitting: Family Medicine

## 2018-03-02 VITALS — BP 136/80 | HR 120 | Temp 97.6°F | Ht 66.0 in | Wt 130.0 lb

## 2018-03-02 DIAGNOSIS — J069 Acute upper respiratory infection, unspecified: Secondary | ICD-10-CM

## 2018-03-02 MED ORDER — AMOXICILLIN-POT CLAVULANATE 875-125 MG PO TABS: 1.0000 | ORAL_TABLET | Freq: Two times a day (BID) | ORAL | 0 refills | Status: DC

## 2018-03-02 NOTE — Progress Notes (Signed)
BP 136/80   Pulse (!) 120   Temp 97.6 F (36.4 C) (Oral)   Ht 5\' 6"  (1.676 m)   Wt 130 lb (59 kg)   SpO2 99%   BMI 20.98 kg/m    Subjective:    Patient ID: Jacqueline Salinas, female    DOB: 09/19/1970, 47 y.o.   MRN: [id number]  HPI: Jacqueline Salinas is a 47 y.o. female  Chief Complaint  Patient presents with  . Headache  . Cough    since last Friday/pt has tried OTC mucinex, sudafed   . Nasal Congestion   1 week of productive cough, nasal congestion, fatigue, Taking mucinex and sudafed but not getting relief. First grade teacher, lots of sick contacts. No hx of asthma or allergies.   Relevant past medical, surgical, family and social history reviewed and updated as indicated. Interim medical history since our last visit reviewed. Allergies and medications reviewed and updated.  Review of Systems  Per HPI unless specifically indicated above     Objective:    BP 136/80   Pulse (!) 120   Temp 97.6 F (36.4 C) (Oral)   Ht 5\' 6"  (1.676 m)   Wt 130 lb (59 kg)   SpO2 99%   BMI 20.98 kg/m   Wt Readings from Last 3 Encounters:  03/02/18 130 lb (59 kg)  12/22/17 130 lb 6 oz (59.1 kg)  12/31/14 144 lb (65.3 kg)    Physical Exam Vitals signs and nursing note reviewed.  Constitutional:      Appearance: Normal appearance. She is well-developed.  HENT:     Head: Atraumatic.     Comments:      Right Ear: Tympanic membrane and external ear normal.     Left Ear: Tympanic membrane and external ear normal.     Nose: Congestion present.     Mouth/Throat:     Mouth: Mucous membranes are moist.     Pharynx: Posterior oropharyngeal erythema present.  Eyes:     Extraocular Movements: Extraocular movements intact.     Conjunctiva/sclera: Conjunctivae normal.     Pupils: Pupils are equal, round, and reactive to light.  Neck:     Musculoskeletal: Normal range of motion and neck supple.  Cardiovascular:     Rate and Rhythm: Normal rate and regular rhythm.     Heart sounds:  Normal heart sounds.  Pulmonary:     Effort: Pulmonary effort is normal.     Breath sounds: Normal breath sounds. No wheezing.  Musculoskeletal: Normal range of motion.  Skin:    General: Skin is warm and dry.  Neurological:     Mental Status: She is alert and oriented to person, place, and time.  Psychiatric:        Mood and Affect: Mood normal.        Thought Content: Thought content normal.     Results for orders placed or performed in visit on 12/22/17  CBC with Differential/Platelet  Result Value Ref Range   WBC 8.5 3.4 - 10.8 x10E3/uL   RBC 4.64 3.77 - 5.28 x10E6/uL   Hemoglobin 14.1 11.1 - 15.9 g/dL   Hematocrit 40.941.5 81.134.0 - 46.6 %   MCV 89 79 - 97 fL   MCH 30.4 26.6 - 33.0 pg   MCHC 34.0 31.5 - 35.7 g/dL   RDW 91.412.3 78.212.3 - 95.615.4 %   Platelets 359 150 - 450 x10E3/uL   Neutrophils 53 Not Estab. %   Lymphs 37 Not Estab. %  Monocytes 7 Not Estab. %   Eos 2 Not Estab. %   Basos 1 Not Estab. %   Neutrophils Absolute 4.5 1.4 - 7.0 x10E3/uL   Lymphocytes Absolute 3.2 (H) 0.7 - 3.1 x10E3/uL   Monocytes Absolute 0.6 0.1 - 0.9 x10E3/uL   EOS (ABSOLUTE) 0.1 0.0 - 0.4 x10E3/uL   Basophils Absolute 0.1 0.0 - 0.2 x10E3/uL   Immature Granulocytes 0 Not Estab. %   Immature Grans (Abs) 0.0 0.0 - 0.1 x10E3/uL  Comprehensive metabolic panel  Result Value Ref Range   Glucose 87 65 - 99 mg/dL   BUN 15 6 - 24 mg/dL   Creatinine, Ser 4.030.77 0.57 - 1.00 mg/dL   GFR calc non Af Amer 93 >59 mL/min/1.73   GFR calc Af Amer 107 >59 mL/min/1.73   BUN/Creatinine Ratio 19 9 - 23   Sodium 137 134 - 144 mmol/L   Potassium 4.0 3.5 - 5.2 mmol/L   Chloride 100 96 - 106 mmol/L   CO2 22 20 - 29 mmol/L   Calcium 9.0 8.7 - 10.2 mg/dL   Total Protein 6.4 6.0 - 8.5 g/dL   Albumin 4.1 3.5 - 5.5 g/dL   Globulin, Total 2.3 1.5 - 4.5 g/dL   Albumin/Globulin Ratio 1.8 1.2 - 2.2   Bilirubin Total <0.2 0.0 - 1.2 mg/dL   Alkaline Phosphatase 43 39 - 117 IU/L   AST 17 0 - 40 IU/L   ALT 11 0 - 32 IU/L    Lipid Panel w/o Chol/HDL Ratio  Result Value Ref Range   Cholesterol, Total 204 (H) 100 - 199 mg/dL   Triglycerides 77 0 - 149 mg/dL   HDL 97 >47>39 mg/dL   VLDL Cholesterol Cal 15 5 - 40 mg/dL   LDL Calculated 92 0 - 99 mg/dL  TSH  Result Value Ref Range   TSH 2.130 0.450 - 4.500 uIU/mL  UA/M w/rflx Culture, Routine  Result Value Ref Range   Specific Gravity, UA <1.005 (L) 1.005 - 1.030   pH, UA 6.0 5.0 - 7.5   Color, UA Straw Yellow   Appearance Ur Clear Clear   Leukocytes, UA Negative Negative   Protein, UA Negative Negative/Trace   Glucose, UA Negative Negative   Ketones, UA Negative Negative   RBC, UA Negative Negative   Bilirubin, UA Negative Negative   Urobilinogen, Ur 0.2 0.2 - 1.0 mg/dL   Nitrite, UA Negative Negative  HIV Antibody (routine testing w rflx)  Result Value Ref Range   HIV Screen 4th Generation wRfx Non Reactive Non Reactive      Assessment & Plan:   Problem List Items Addressed This Visit    None    Visit Diagnoses    Upper respiratory tract infection, unspecified type    -  Primary   Tx with augmentin, continue OTC remedies and supportive care. Follow up if not improving       Follow up plan: Return if symptoms worsen or fail to improve.

## 2018-04-27 ENCOUNTER — Ambulatory Visit (INDEPENDENT_AMBULATORY_CARE_PROVIDER_SITE_OTHER): Payer: BC Managed Care – PPO | Admitting: Nurse Practitioner

## 2018-04-27 ENCOUNTER — Encounter: Payer: Self-pay | Admitting: Nurse Practitioner

## 2018-04-27 ENCOUNTER — Ambulatory Visit
Admission: RE | Admit: 2018-04-27 | Discharge: 2018-04-27 | Disposition: A | Discharge status: Home / Self Care | Payer: BC Managed Care – PPO | Source: Ambulatory Visit | Attending: Nurse Practitioner | Admitting: Nurse Practitioner

## 2018-04-27 VITALS — BP 112/74 | HR 90 | Temp 97.8°F | Ht 66.0 in | Wt 130.5 lb

## 2018-04-27 DIAGNOSIS — R0789 Other chest pain: Secondary | ICD-10-CM | POA: Insufficient documentation

## 2018-04-27 DIAGNOSIS — R079 Chest pain, unspecified: Secondary | ICD-10-CM | POA: Diagnosis present

## 2018-04-27 MED ORDER — CYCLOBENZAPRINE HCL 5 MG PO TABS: 5.0000 mg | ORAL_TABLET | Freq: Two times a day (BID) | ORAL | 0 refills | Status: DC | PRN

## 2018-04-27 NOTE — Progress Notes (Signed)
BP 112/74 (BP Location: Left Arm, Patient Position: Sitting, Cuff Size: Normal)   Pulse 90 Comment: apical  Temp 97.8 F (36.6 C)   Ht 5\' 6"  (1.676 m)   Wt 130 lb 8 oz (59.2 kg)   SpO2 100%   BMI 21.06 kg/m    Subjective:    Patient ID: Jacqueline Salinas, female    DOB: 06/24/1970, 48 y.o.   MRN: [id number]  HPI: Jacqueline Salinas is a 48 y.o. female  Chief Complaint  Patient presents with  . Chest Pain    Patient has a pain in her right side of chest, arm, neck. She was diagnosed with the flu on Saturday   CHEST PAIN (RIGHT SIDE UNDER AXILLA) Was diagnosed with Flu B on Saturday and started on Tamiflu.  Off work Monday and returned to work Tuesday and Wednesday . Last night she was starting dinner and noticed right side pain.  Denies cough with flu.  Symptoms with flu included aching all over and headache.  Has used heating pad and Icy/Hot at home to right side and feels this relieves pain.  States she has felt like this before one time when she "slept funny" and it was similar presentation, but dissipated over a few days.  Reports pain is worse to right side when she takes a deep breath or lifts right arm out to side or bend forward with arm.  Denies presence of cough at this time.   Time since onset: Duration:last night Onset: gradual Quality: dull and aching, increases when she takes a deep breath or changes arm positions Severity: 10/10 worst, at best 5/10 Location: upper right Radiation:right side up to neck Episode duration:  Frequency: constant Related to exertion: no Activity when pain started: none Trauma: no Anxiety/recent stressors: yes Aggravating factors: as noted above Alleviating factors: as noted above Status: stable Treatments attempted: heat pad, Icy/Hot and ibuprofen  Current pain status: in pain Shortness of breath: no Cough: no Nausea: no Diaphoresis: no Heartburn: no Palpitations: no  Relevant past medical, surgical, family and social history reviewed  and updated as indicated. Interim medical history since our last visit reviewed. Allergies and medications reviewed and updated.  Review of Systems  Constitutional: Negative for activity change, appetite change, diaphoresis, fatigue and fever.  Respiratory: Negative for cough, chest tightness and shortness of breath.   Cardiovascular: Positive for chest pain (right sided under axilla). Negative for palpitations and leg swelling.  Gastrointestinal: Negative for abdominal distention, abdominal pain, constipation, diarrhea, nausea and vomiting.  Endocrine: Negative for cold intolerance, heat intolerance, polydipsia, polyphagia and polyuria.  Musculoskeletal: Positive for neck pain.  Neurological: Negative for dizziness, syncope, weakness, light-headedness, numbness and headaches.  Psychiatric/Behavioral: Negative.     Per HPI unless specifically indicated above     Objective:    BP 112/74 (BP Location: Left Arm, Patient Position: Sitting, Cuff Size: Normal)   Pulse 90 Comment: apical  Temp 97.8 F (36.6 C)   Ht 5\' 6"  (1.676 m)   Wt 130 lb 8 oz (59.2 kg)   SpO2 100%   BMI 21.06 kg/m   Wt Readings from Last 3 Encounters:  04/27/18 130 lb 8 oz (59.2 kg)  03/02/18 130 lb (59 kg)  12/22/17 130 lb 6 oz (59.1 kg)    Physical Exam Vitals signs and nursing note reviewed.  Constitutional:      General: She is awake.     Appearance: She is well-developed.  HENT:     Head: Normocephalic.  Eyes:     General:        Right eye: No discharge.        Left eye: No discharge.     Conjunctiva/sclera: Conjunctivae normal.     Pupils: Pupils are equal, round, and reactive to light.  Neck:     Musculoskeletal: Normal range of motion and neck supple.     Thyroid: No thyromegaly.     Vascular: No carotid bruit or JVD.  Cardiovascular:     Rate and Rhythm: Normal rate and regular rhythm.     Chest Wall: PMI is not displaced. No thrill.     Pulses: Normal pulses.     Heart sounds: Normal  heart sounds. No murmur. No gallop.   Pulmonary:     Effort: Pulmonary effort is normal.     Breath sounds: Normal breath sounds.     Comments: Clear lung sounds throughout with tenderness noted on palpation to right upper chest near axilla and mild discomfort with deep breaths.  No bruising or edema/erythema noted.   Abdominal:     General: Bowel sounds are normal.     Palpations: Abdomen is soft.  Musculoskeletal:     Right lower leg: No edema.     Left lower leg: No edema.     Comments: Tenderness to right upper chest by axilla.  Able to recreate pain with movement of arm out to side and forward.  No pain on upward movement.  Lymphadenopathy:     Head:     Right side of head: No submental, submandibular or tonsillar adenopathy.     Left side of head: No submental, submandibular or tonsillar adenopathy.     Cervical: No cervical adenopathy.  Skin:    General: Skin is warm and dry.  Neurological:     Mental Status: She is alert and oriented to person, place, and time.     Cranial Nerves: Cranial nerves are intact.     Deep Tendon Reflexes: Reflexes are normal and symmetric.     Reflex Scores:      Brachioradialis reflexes are 2+ on the right side and 2+ on the left side.      Patellar reflexes are 2+ on the right side and 2+ on the left side. Psychiatric:        Attention and Perception: Attention normal.        Mood and Affect: Mood is anxious.        Speech: Speech normal.        Behavior: Behavior normal. Behavior is cooperative.        Thought Content: Thought content normal.        Judgment: Judgment normal.     Comments: Initially very anxious mood, this improved throughout exam period with decrease in anxiety and tearfulness.    EKG WITH SINUS TACHYCARDIA, artifact present due to patient anxiety (tearfulness and shaking) during procedure.  Results for orders placed or performed in visit on 12/22/17  CBC with Differential/Platelet  Result Value Ref Range   WBC 8.5 3.4  - 10.8 x10E3/uL   RBC 4.64 3.77 - 5.28 x10E6/uL   Hemoglobin 14.1 11.1 - 15.9 g/dL   Hematocrit 16.1 09.6 - 46.6 %   MCV 89 79 - 97 fL   MCH 30.4 26.6 - 33.0 pg   MCHC 34.0 31.5 - 35.7 g/dL   RDW 04.5 40.9 - 81.1 %   Platelets 359 150 - 450 x10E3/uL   Neutrophils 53 Not Estab. %  Lymphs 37 Not Estab. %   Monocytes 7 Not Estab. %   Eos 2 Not Estab. %   Basos 1 Not Estab. %   Neutrophils Absolute 4.5 1.4 - 7.0 x10E3/uL   Lymphocytes Absolute 3.2 (H) 0.7 - 3.1 x10E3/uL   Monocytes Absolute 0.6 0.1 - 0.9 x10E3/uL   EOS (ABSOLUTE) 0.1 0.0 - 0.4 x10E3/uL   Basophils Absolute 0.1 0.0 - 0.2 x10E3/uL   Immature Granulocytes 0 Not Estab. %   Immature Grans (Abs) 0.0 0.0 - 0.1 x10E3/uL  Comprehensive metabolic panel  Result Value Ref Range   Glucose 87 65 - 99 mg/dL   BUN 15 6 - 24 mg/dL   Creatinine, Ser 3.29 0.57 - 1.00 mg/dL   GFR calc non Af Amer 93 >59 mL/min/1.73   GFR calc Af Amer 107 >59 mL/min/1.73   BUN/Creatinine Ratio 19 9 - 23   Sodium 137 134 - 144 mmol/L   Potassium 4.0 3.5 - 5.2 mmol/L   Chloride 100 96 - 106 mmol/L   CO2 22 20 - 29 mmol/L   Calcium 9.0 8.7 - 10.2 mg/dL   Total Protein 6.4 6.0 - 8.5 g/dL   Albumin 4.1 3.5 - 5.5 g/dL   Globulin, Total 2.3 1.5 - 4.5 g/dL   Albumin/Globulin Ratio 1.8 1.2 - 2.2   Bilirubin Total <0.2 0.0 - 1.2 mg/dL   Alkaline Phosphatase 43 39 - 117 IU/L   AST 17 0 - 40 IU/L   ALT 11 0 - 32 IU/L  Lipid Panel w/o Chol/HDL Ratio  Result Value Ref Range   Cholesterol, Total 204 (H) 100 - 199 mg/dL   Triglycerides 77 0 - 149 mg/dL   HDL 97 >51 mg/dL   VLDL Cholesterol Cal 15 5 - 40 mg/dL   LDL Calculated 92 0 - 99 mg/dL  TSH  Result Value Ref Range   TSH 2.130 0.450 - 4.500 uIU/mL  UA/M w/rflx Culture, Routine  Result Value Ref Range   Specific Gravity, UA <1.005 (L) 1.005 - 1.030   pH, UA 6.0 5.0 - 7.5   Color, UA Straw Yellow   Appearance Ur Clear Clear   Leukocytes, UA Negative Negative   Protein, UA Negative  Negative/Trace   Glucose, UA Negative Negative   Ketones, UA Negative Negative   RBC, UA Negative Negative   Bilirubin, UA Negative Negative   Urobilinogen, Ur 0.2 0.2 - 1.0 mg/dL   Nitrite, UA Negative Negative  HIV Antibody (routine testing w rflx)  Result Value Ref Range   HIV Screen 4th Generation wRfx Non Reactive Non Reactive      Assessment & Plan:   Problem List Items Addressed This Visit      Other   Chest wall pain - Primary    Acute, appears MSK in nature.  Able to recreate pain with positional changes and point tenderness to right upper chest, increased discomfort with deep breaths; has had relief at home with heating pad and Icy/Hot.  EKG showing sinus tachy (of note patient very anxious during procedure and apical pulse during exam improved with HR 90).  Will obtain CXR for further evaluation.  Continue simple treatment at home and script for 5MG  Flexeril sent (not to take this with her Klonopin or while driving).  Recommend rest tomorrow, off work.  Discussed s/s to immediately go to ER if present.  Return for worsening or continued issues.      Relevant Medications   cyclobenzaprine (FLEXERIL) 5 MG tablet   Other  Relevant Orders   EKG 12-Lead (Completed)   DG Chest 2 View       Follow up plan: Return if symptoms worsen or fail to improve.

## 2018-04-27 NOTE — Assessment & Plan Note (Addendum)
Acute, appears MSK in nature.  Able to recreate pain with positional changes and point tenderness to right upper chest, increased discomfort with deep breaths; has had relief at home with heating pad and Icy/Hot.  EKG showing sinus tachy (of note patient very anxious during procedure and apical pulse during exam improved with HR 90).  Will obtain CXR for further evaluation.  Continue simple treatment at home and script for 5MG  Flexeril sent (not to take this with her Klonopin or while driving).  Recommend rest tomorrow, off work.  Discussed s/s to immediately go to ER if present.  Return for worsening or continued issues.

## 2018-04-27 NOTE — Patient Instructions (Signed)

## 2018-05-01 ENCOUNTER — Ambulatory Visit: Payer: Self-pay | Admitting: Family Medicine

## 2018-05-01 NOTE — Telephone Encounter (Signed)
I returned her call.    She is c/o feeling like she is getting the flu again.    She started feeling bad on 2/14.   She went to the urgent care on 2/15 and was diagnosed with influenza B.   She took the Tamiflu as directed.   She got better but now last night she feels like she did the first day she was getting the flu.    The main symptoms is the bad body aches on and off with continued weakness.   She also feels feverish.  See triage notes.  I scheduled her with Aura Dials, DNP for 05/02/2018 at 9:00.    Reason for Disposition . [1] Fever returns after gone for over 24 hours AND [2] symptoms worse or not improved  Answer Assessment - Initial Assessment Questions 1. WORST SYMPTOM: "What is your worst symptom?" (e.g., cough, runny nose, muscle aches, headache, sore throat, fever)      I was seen at urgent care and dx with flu B on 04/21/2018.   I took the Tamiflu.  2. ONSET: "When did your flu symptoms start?"      The whole week my symptoms come and go.   I ache and have chills on and off.   I feel like the flu is coming and going.    I felt like I did when I first was diagnosed with the flu.     I'm still having stuffy nose and body aches and chills.   Feel feverish.   Very tired.   The aches are worst.   They come and go.  I've had some good days but now I feel bad again. 3. COUGH: "How bad is the cough?"       Not bad.   Not keeping me up at night.   I didn't have a cough in the beginning. 4. RESPIRATORY DISTRESS: "Describe your breathing."      I was told I have muscle strain.   She gave me muscle relaxers which helped.  This came on all of a sudden.   No accidents or injuries. 5. FEVER: "Do you have a fever?" If so, ask: "What is your temperature, how was it measured, and when did it start?"     I feel feverish. 6. EXPOSURE: "Were you exposed to someone with influenza?"       Possible.   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   I've never had the flu until this year. 7. FLU VACCINE: "Did you get a flu  shot this year?"     Yes 8. HIGH RISK DISEASE: "Do you any chronic medical problems?" (e.g., heart or lung disease, asthma, weak immune system, or other HIGH RISK conditions)     No 9. PREGNANCY: "Is there any chance you are pregnant?" "When was your last menstrual period?"     No 10. OTHER SYMPTOMS: "Do you have any other symptoms?"  (e.g., runny nose, muscle aches, headache, sore throat)       Nasal congestion a little.   Muscle aches bad on and off.   Mild sore throat.  Protocols used: INFLUENZA - SEASONAL-A-AH

## 2018-05-02 ENCOUNTER — Ambulatory Visit: Payer: BC Managed Care – PPO | Admitting: Nurse Practitioner

## 2018-12-26 ENCOUNTER — Encounter: Payer: BC Managed Care – PPO | Admitting: Family Medicine

## 2019-05-23 ENCOUNTER — Encounter: Payer: Self-pay | Admitting: Dermatology

## 2019-05-23 ENCOUNTER — Other Ambulatory Visit: Payer: Self-pay

## 2019-05-23 ENCOUNTER — Ambulatory Visit: Payer: BC Managed Care – PPO | Admitting: Dermatology

## 2019-05-23 DIAGNOSIS — L700 Acne vulgaris: Secondary | ICD-10-CM

## 2019-05-23 MED ORDER — DOXYCYCLINE HYCLATE 100 MG PO CAPS: 100.0000 mg | ORAL_CAPSULE | Freq: Two times a day (BID) | ORAL | 0 refills | Status: DC

## 2019-05-23 MED ORDER — CLINDAMYCIN PHOSPHATE 1 % EX LOTN: TOPICAL_LOTION | Freq: Every day | CUTANEOUS | 3 refills | Status: AC

## 2019-05-23 MED ORDER — TRIAMCINOLONE ACETONIDE 10 MG/ML IJ SUSP: 10.0000 mg | Freq: Once | INTRAMUSCULAR | Status: DC

## 2019-05-23 NOTE — Progress Notes (Signed)
   Follow-Up Visit   Subjective  ELIZE PINON is a 49 y.o. female who presents for the following: Cyst (R chin x 1 week, treating with Clindamycin lotion and Elidel cream with a good response ). Bump Under Skin Her subcutaneous nodule is located over the DERM LESION LOCATION: chin. This has been present for 1 week(s). There has been swelling.    The following portions of the chart were reviewed this encounter and updated as appropriate:     Review of Systems: No other skin or systemic complaints.  Objective  Well appearing patient in no apparent distress; mood and affect are within normal limits.  A focused examination was performed including face/R chin. Relevant physical exam findings are noted in the Assessment and Plan.  Objective  R chin: Inflamed acne cyst - red tender nodule 1.5 cm  Start Doxycycline 100mg  take 1 tablet daily with food prn Cont Clindamycin lotion apply to face qd-bid  Kenalog 1.25mg /ml Injected .05cc to R chin acne cyst Lot #[id number] Apr 2022  Assessment & Plan    Cystic acne vulgaris R chin  doxycycline (VIBRAMYCIN) 100 MG capsule - R chin  clindamycin (CLEOCIN-T) 1 % lotion - R chin  triamcinolone acetonide (KENALOG) 10 MG/ML injection 1.3 mg - R chin  Return if symptoms worsen or fail to improve.   Documentation: I have reviewed the above documentation for accuracy and completeness, and I agree with the above.  10-16-1999, MD

## 2019-05-24 MED ORDER — TRIAMCINOLONE ACETONIDE 10 MG/ML IJ SUSP
1.2500 mg | Freq: Once | INTRAMUSCULAR | Status: AC
  Administered 2019-05-23: 1.3 mg

## 2019-05-24 NOTE — Progress Notes (Deleted)
   Follow-Up Visit   Subjective  Jacqueline Salinas is a 49 y.o. female who presents for the following: Cyst (R chin x 1 week, treating with Clindamycin lotion and Elidel cream with a good response ). Bump Under Skin Her subcutaneous nodule is located over the DERM LESION LOCATION: R chin. This has been present for a few week(s). There has been swelling.   The following portions of the chart were reviewed this encounter and updated as appropriate:     Review of Systems: No other skin or systemic complaints.  Objective  Well appearing patient in no apparent distress; mood and affect are within normal limits.  A focused examination was performed including R chin. Relevant physical exam findings are noted in the Assessment and Plan.  Objective  R chin: Inflamed acne cyst - red tender nodule 1.5 cm  Start Doxycycline 100mg  take 1 tablet daily with food prn Cont Clindamycin lotion apply to face qd-bid  Kenalog 1.31ml  Injected .05cc to R chin acne cyst Lot #[id number] Apr 2022  Assessment & Plan  Acne vulgaris R chin  doxycycline (VIBRAMYCIN) 100 MG capsule - R chin  clindamycin (CLEOCIN-T) 1 % lotion - R chin  triamcinolone acetonide (KENALOG) 10 MG/ML injection 1.3 mg - R chin

## 2019-07-11 ENCOUNTER — Other Ambulatory Visit: Payer: Self-pay | Admitting: Dermatology

## 2019-07-11 DIAGNOSIS — L700 Acne vulgaris: Secondary | ICD-10-CM

## 2019-08-23 ENCOUNTER — Other Ambulatory Visit: Payer: Self-pay

## 2019-08-23 ENCOUNTER — Ambulatory Visit (INDEPENDENT_AMBULATORY_CARE_PROVIDER_SITE_OTHER): Payer: BC Managed Care – PPO | Admitting: Family Medicine

## 2019-08-23 ENCOUNTER — Encounter: Payer: Self-pay | Admitting: Family Medicine

## 2019-08-23 VITALS — BP 130/83 | HR 103 | Temp 97.7°F | Wt 131.0 lb

## 2019-08-23 DIAGNOSIS — L819 Disorder of pigmentation, unspecified: Secondary | ICD-10-CM

## 2019-08-23 MED ORDER — AMLODIPINE BESYLATE 2.5 MG PO TABS: 2.5000 mg | ORAL_TABLET | Freq: Every day | ORAL | 0 refills | Status: DC

## 2019-08-23 NOTE — Progress Notes (Signed)
BP 130/83   Pulse (!) 103   Temp 97.7 F (36.5 C) (Oral)   Wt 131 lb (59.4 kg)   SpO2 100%   BMI 21.14 kg/m    Subjective:    Patient ID: Jacqueline Salinas, female    DOB: 1970/04/18, 49 y.o.   MRN: [id number]  HPI: Jacqueline Salinas is a 49 y.o. female  Chief Complaint  Patient presents with  . Foot Problem    pt states that hse has had bilateral burning and redish feet for a month ago   B/l feet burning, red/purple discoloration, and sweating x 6 months. Seems worse when she goes outside. Has used OTC athlete's foot spray which didn't really seem to help. No hx of injury, varicose veins, leg swelling. Notes sometimes her fingers/hands are also discolored and burning like this as well.   Relevant past medical, surgical, family and social history reviewed and updated as indicated. Interim medical history since our last visit reviewed. Allergies and medications reviewed and updated.  Review of Systems  Per HPI unless specifically indicated above     Objective:    BP 130/83   Pulse (!) 103   Temp 97.7 F (36.5 C) (Oral)   Wt 131 lb (59.4 kg)   SpO2 100%   BMI 21.14 kg/m   Wt Readings from Last 3 Encounters:  08/23/19 131 lb (59.4 kg)  04/27/18 130 lb 8 oz (59.2 kg)  03/02/18 130 lb (59 kg)    Physical Exam Vitals and nursing note reviewed.  Constitutional:      Appearance: Normal appearance. She is not ill-appearing.  HENT:     Head: Atraumatic.  Eyes:     Extraocular Movements: Extraocular movements intact.     Conjunctiva/sclera: Conjunctivae normal.  Cardiovascular:     Rate and Rhythm: Normal rate and regular rhythm.     Pulses: Normal pulses.     Heart sounds: Normal heart sounds.  Pulmonary:     Effort: Pulmonary effort is normal.     Breath sounds: Normal breath sounds.  Musculoskeletal:        General: No swelling, tenderness or deformity. Normal range of motion.     Cervical back: Normal range of motion and neck supple.  Skin:    General: Skin is  warm and dry.     Comments: B/l fingers and toes cool to touch and deep red/purple color  Neurological:     Mental Status: She is alert and oriented to person, place, and time.  Psychiatric:        Mood and Affect: Mood normal.        Thought Content: Thought content normal.        Judgment: Judgment normal.     Results for orders placed or performed in visit on 12/22/17  CBC with Differential/Platelet  Result Value Ref Range   WBC 8.5 3.4 - 10.8 x10E3/uL   RBC 4.64 3.77 - 5.28 x10E6/uL   Hemoglobin 14.1 11.1 - 15.9 g/dL   Hematocrit 41.5 34.0 - 46.6 %   MCV 89 79 - 97 fL   MCH 30.4 26.6 - 33.0 pg   MCHC 34.0 31 - 35 g/dL   RDW 12.3 12.3 - 15.4 %   Platelets 359 150 - 450 x10E3/uL   Neutrophils 53 Not Estab. %   Lymphs 37 Not Estab. %   Monocytes 7 Not Estab. %   Eos 2 Not Estab. %   Basos 1 Not Estab. %   Neutrophils  Absolute 4.5 1 - 7 x10E3/uL   Lymphocytes Absolute 3.2 (H) 0 - 3 x10E3/uL   Monocytes Absolute 0.6 0 - 0 x10E3/uL   EOS (ABSOLUTE) 0.1 0.0 - 0.4 x10E3/uL   Basophils Absolute 0.1 0 - 0 x10E3/uL   Immature Granulocytes 0 Not Estab. %   Immature Grans (Abs) 0.0 0.0 - 0.1 x10E3/uL  Comprehensive metabolic panel  Result Value Ref Range   Glucose 87 65 - 99 mg/dL   BUN 15 6 - 24 mg/dL   Creatinine, Ser 3.55 0.57 - 1.00 mg/dL   GFR calc non Af Amer 93 >59 mL/min/1.73   GFR calc Af Amer 107 >59 mL/min/1.73   BUN/Creatinine Ratio 19 9 - 23   Sodium 137 134 - 144 mmol/L   Potassium 4.0 3.5 - 5.2 mmol/L   Chloride 100 96 - 106 mmol/L   CO2 22 20 - 29 mmol/L   Calcium 9.0 8.7 - 10.2 mg/dL   Total Protein 6.4 6.0 - 8.5 g/dL   Albumin 4.1 3.5 - 5.5 g/dL   Globulin, Total 2.3 1.5 - 4.5 g/dL   Albumin/Globulin Ratio 1.8 1.2 - 2.2   Bilirubin Total <0.2 0.0 - 1.2 mg/dL   Alkaline Phosphatase 43 39 - 117 IU/L   AST 17 0 - 40 IU/L   ALT 11 0 - 32 IU/L  Lipid Panel w/o Chol/HDL Ratio  Result Value Ref Range   Cholesterol, Total 204 (H) 100 - 199 mg/dL    Triglycerides 77 0 - 149 mg/dL   HDL 97 >73 mg/dL   VLDL Cholesterol Cal 15 5 - 40 mg/dL   LDL Calculated 92 0 - 99 mg/dL  TSH  Result Value Ref Range   TSH 2.130 0.450 - 4.500 uIU/mL  UA/M w/rflx Culture, Routine   Specimen: Urine   URINE  Result Value Ref Range   Specific Gravity, UA <1.005 (L) 1.005 - 1.030   pH, UA 6.0 5.0 - 7.5   Color, UA Straw Yellow   Appearance Ur Clear Clear   Leukocytes, UA Negative Negative   Protein, UA Negative Negative/Trace   Glucose, UA Negative Negative   Ketones, UA Negative Negative   RBC, UA Negative Negative   Bilirubin, UA Negative Negative   Urobilinogen, Ur 0.2 0.2 - 1.0 mg/dL   Nitrite, UA Negative Negative  HIV Antibody (routine testing w rflx)  Result Value Ref Range   HIV Screen 4th Generation wRfx Non Reactive Non Reactive      Assessment & Plan:   Problem List Items Addressed This Visit    None    Visit Diagnoses    Discoloration of skin of foot    -  Primary   Consistent with Raynauds, discussed mechanism, supportive care. Trial low dose amlodipine to help with sxs severity. Monitor for hypotension closely       Follow up plan: Return in about 4 weeks (around 09/20/2019) for CPE with Dr. Laural Benes.

## 2019-08-23 NOTE — Patient Instructions (Signed)
Raynaud Phenomenon ° °Raynaud phenomenon is a condition that affects the blood vessels (arteries) that carry blood to your fingers and toes. The arteries that supply blood to your ears, lips, nipples, or the tip of your nose might also be affected. Raynaud phenomenon causes the arteries to become narrow temporarily (spasm). As a result, the flow of blood to the affected areas is temporarily decreased. This usually occurs in response to cold temperatures or stress. During an attack, the skin in the affected areas turns white, then blue, and finally red. You may also feel tingling or numbness in those areas. °Attacks usually last for only a brief period, and then the blood flow to the area returns to normal. In most cases, Raynaud phenomenon does not cause serious health problems. °What are the causes? °In many cases, the cause of this condition is not known. The condition may occur on its own (primary Raynaud phenomenon) or may be associated with other diseases or factors (secondary Raynaud phenomenon). °Possible causes may include: °· Diseases or medical conditions that damage the arteries. °· Injuries and repetitive actions that hurt the hands or feet. °· Being exposed to certain chemicals. °· Taking medicines that narrow the arteries. °· Other medical conditions, such as lupus, scleroderma, rheumatoid arthritis, thyroid problems, blood disorders, Sjogren syndrome, or atherosclerosis. °What increases the risk? °The following factors may make you more likely to develop this condition: °· Being 20-40 years old. °· Being female. °· Having a family history of Raynaud phenomenon. °· Living in a cold climate. °· Smoking. °What are the signs or symptoms? °Symptoms of this condition usually occur when you are exposed to cold temperatures or when you have emotional stress. The symptoms may last for a few minutes or up to several hours. They usually affect your fingers but may also affect your toes, nipples, lips, ears, or  the tip of your nose. Symptoms may include: °· Changes in skin color. The skin in the affected areas will turn pale or white. The skin may then change from white to bluish to red as normal blood flow returns to the area. °· Numbness, tingling, or pain in the affected areas. °In severe cases, symptoms may include: °· Skin sores. °· Tissues decaying and dying (gangrene). °How is this diagnosed? °This condition may be diagnosed based on: °· Your symptoms and medical history. °· A physical exam. During the exam, you may be asked to put your hands in cold water to check for a reaction to cold temperature. °· Tests, such as: °? Blood tests to check for other diseases or conditions. °? A test to check the movement of blood through your arteries and veins (vascular ultrasound). °? A test in which the skin at the base of your fingernail is examined under a microscope (nailfold capillaroscopy). °How is this treated? °Treatment for this condition often involves making lifestyle changes and taking steps to control your exposure to cold temperatures. For more severe cases, medicine (calcium channel blockers) may be used to improve blood flow. Surgery is sometimes done to block the nerves that control the affected arteries, but this is rare. °Follow these instructions at home: °Avoiding cold temperatures °Take these steps to avoid exposure to cold: °· If possible, stay indoors during cold weather. °· When you go outside during cold weather, dress in layers and wear mittens, a hat, a scarf, and warm footwear. °· Wear mittens or gloves when handling ice or frozen food. °· Use holders for glasses or cans containing cold drinks. °·   Let warm water run for a while before taking a shower or bath. °· Warm up the car before driving in cold weather. °Lifestyle ° °· If possible, avoid stressful and emotional situations. Try to find ways to manage your stress, such as: °? Exercise. °? Yoga. °? Meditation. °? Biofeedback. °· Do not use any  products that contain nicotine or tobacco, such as cigarettes and e-cigarettes. If you need help quitting, ask your health care provider. °· Avoid secondhand smoke. °· Limit your use of caffeine. °? Switch to decaffeinated coffee, tea, and soda. °? Avoid chocolate. °· Avoid vibrating tools and machinery. °General instructions °· Protect your hands and feet from injuries, cuts, or bruises. °· Avoid wearing tight rings or wristbands. °· Wear loose fitting socks and comfortable, roomy shoes. °· Take over-the-counter and prescription medicines only as told by your health care provider. °Contact a health care provider if: °· Your discomfort becomes worse despite lifestyle changes. °· You develop sores on your fingers or toes that do not heal. °· Your fingers or toes turn black. °· You have breaks in the skin on your fingers or toes. °· You have a fever. °· You have pain or swelling in your joints. °· You have a rash. °· Your symptoms occur on only one side of your body. °Summary °· Raynaud phenomenon is a condition that affects the arteries that carry blood to your fingers, toes, ears, lips, nipples, or the tip of your nose. °· In many cases, the cause of this condition is not known. °· Symptoms of this condition include changes in skin color, and numbness and tingling of the affected area. °· Treatment for this condition includes lifestyle changes, reducing exposure to cold temperatures, and using medicines for severe cases of the condition. °· Contact your health care provider if your condition worsens despite treatment. °This information is not intended to replace advice given to you by your health care provider. Make sure you discuss any questions you have with your health care provider. °Document Revised: 02/25/2017 Document Reviewed: 04/05/2016 °Elsevier Patient Education © 2020 [employer] ° °

## 2019-09-26 ENCOUNTER — Other Ambulatory Visit: Payer: Self-pay | Admitting: Family Medicine

## 2019-09-26 ENCOUNTER — Encounter: Payer: BC Managed Care – PPO | Admitting: Family Medicine

## 2019-09-26 NOTE — Telephone Encounter (Signed)
Requested  medications are  due for refill today yes  Requested medications are on the active medication list yes  Last refill 6/17  Last visit 12/2017 except for illness visits  Future visit scheduled no, canceled today, 09/26/19  Notes to clinic Was given 30 day supply with an appt to assess. Appt was canceled for today 7/21, no appt scheduled. Last comprehensive visit 12/2017, failed protocol of valid visit within 6 months.

## 2019-09-27 ENCOUNTER — Other Ambulatory Visit: Payer: Self-pay | Admitting: Dermatology

## 2019-09-27 DIAGNOSIS — L700 Acne vulgaris: Secondary | ICD-10-CM

## 2019-10-05 ENCOUNTER — Ambulatory Visit (INDEPENDENT_AMBULATORY_CARE_PROVIDER_SITE_OTHER): Payer: BC Managed Care – PPO | Admitting: Family Medicine

## 2019-10-05 ENCOUNTER — Other Ambulatory Visit: Payer: Self-pay

## 2019-10-05 ENCOUNTER — Encounter: Payer: Self-pay | Admitting: Family Medicine

## 2019-10-05 VITALS — BP 129/83 | HR 104 | Temp 97.5°F | Wt 132.2 lb

## 2019-10-05 DIAGNOSIS — I7300 Raynaud's syndrome without gangrene: Secondary | ICD-10-CM

## 2019-10-05 MED ORDER — AMLODIPINE BESYLATE 2.5 MG PO TABS: 2.5000 mg | ORAL_TABLET | Freq: Every day | ORAL | 0 refills | Status: DC

## 2019-10-05 NOTE — Progress Notes (Signed)
Going to come back in 2 weeks for physical

## 2019-10-07 DIAGNOSIS — U071 COVID-19: Secondary | ICD-10-CM

## 2019-10-07 HISTORY — DX: COVID-19: U07.1

## 2019-10-31 ENCOUNTER — Ambulatory Visit (INDEPENDENT_AMBULATORY_CARE_PROVIDER_SITE_OTHER): Payer: BC Managed Care – PPO | Admitting: Nurse Practitioner

## 2019-10-31 ENCOUNTER — Other Ambulatory Visit: Payer: Self-pay

## 2019-10-31 ENCOUNTER — Encounter: Payer: Self-pay | Admitting: Nurse Practitioner

## 2019-10-31 ENCOUNTER — Other Ambulatory Visit: Payer: BC Managed Care – PPO

## 2019-10-31 DIAGNOSIS — R0981 Nasal congestion: Secondary | ICD-10-CM | POA: Diagnosis not present

## 2019-10-31 DIAGNOSIS — Z20822 Contact with and (suspected) exposure to covid-19: Secondary | ICD-10-CM

## 2019-10-31 NOTE — Patient Instructions (Signed)

## 2019-10-31 NOTE — Progress Notes (Signed)
Temp 98 F (36.7 C) (Oral)    Subjective:    Patient ID: Jacqueline Salinas, female    DOB: 06/24/1970, 49 y.o.   MRN: [id number]  HPI: Jacqueline Salinas is a 49 y.o. female  Chief Complaint  Patient presents with  . URI    pt states she has been having a cough since Thursday, then started feeling very bad yesterday, loss of taste, stuffy nose, congestion. States she did go and have a COVID test this morning. 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 This visit was completed via telephone due to the restrictions of the COVID-19 pandemic. All issues as above were discussed and addressed but no physical exam was performed. If it was felt that the patient should be evaluated in the office, they were directed there. The patient verbally consented to this visit. Patient was unable to complete an audio/visual visit due to telephone only. Due to the catastrophic nature of the COVID-19 pandemic, this visit was done through audio contact only. . Location of the patient: home . Location of the provider: work . Those involved with this call:  . Provider: Aura Dials, DNP . CMA: Wilhemena Durie, CMA . Front Desk/Registration: Adela Ports  . Time spent on call: 20 minutes on the phone discussing health concerns. 15 minutes total spent in review of patient's record and preparation of their chart.  . I verified patient identity using two factors (patient name and date of birth). Patient consents verbally to being seen via telemedicine visit today.    UPPER RESPIRATORY TRACT INFECTION Presents for URI symptoms since 10/25/19, started with dry cough.  Cough has improved, but now has overwhelming feeling of not feeling good.  Reports loss of taste and smell yesterday.  Has congestion and runny nose.  No fever.    Obtain Covid test this morning at 0900.  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  She vaccinated, 2nd dose 06/05/19. Fever: no Cough: no Shortness of breath: no Wheezing: no Chest pain: no Chest tightness: no Chest congestion: no Nasal congestion: yes Runny nose: yes Post nasal drip: yes Sneezing: no Sore throat: yes Swollen glands: no Sinus pressure: yes Headache: yes Face pain: no Toothache: no Ear pain: none Ear pressure: none Eyes red/itching:no Eye drainage/crusting: no  Vomiting: no Rash: no Fatigue: yes Sick contacts: yes Strep contacts: no  Context: stable Recurrent sinusitis: no Relief with OTC cold/cough medications: no  Treatments attempted: none   Relevant past medical, surgical, family and social history reviewed and updated as indicated. Interim medical history since our last visit reviewed. Allergies and medications reviewed and updated.  Review of Systems  Constitutional: Positive for fatigue. Negative for activity change, appetite change and fever.  HENT: Positive for congestion, postnasal drip, rhinorrhea, sinus pressure and sore throat. Negative for ear discharge, ear pain, facial swelling, sinus pain, sneezing and voice change.   Eyes: Negative for pain and visual disturbance.  Respiratory: Negative for cough, chest tightness, shortness of breath and wheezing.   Cardiovascular: Negative for chest pain, palpitations and leg swelling.  Gastrointestinal: Negative for abdominal distention, abdominal pain, constipation, diarrhea, nausea and vomiting.  Endocrine: Negative.   Musculoskeletal: Negative for myalgias.  Neurological: Positive for headaches. Negative for dizziness and numbness.  Psychiatric/Behavioral: Negative.     Per HPI unless specifically indicated  above     Objective:    Temp 98 F (36.7 C) (Oral)   Wt Readings from Last 3 Encounters:  10/05/19 132 lb 3.2 oz (60 kg)  08/23/19 131 lb (59.4 kg)  04/27/18 130 lb 8 oz (59.2 kg)    Physical Exam   Unable to perform due to  telephone visit only.  Results for orders placed or performed in visit on 12/22/17  CBC with Differential/Platelet  Result Value Ref Range   WBC 8.5 3.4 - 10.8 x10E3/uL   RBC 4.64 3.77 - 5.28 x10E6/uL   Hemoglobin 14.1 11.1 - 15.9 g/dL   Hematocrit 25.4 27.0 - 46.6 %   MCV 89 79 - 97 fL   MCH 30.4 26.6 - 33.0 pg   MCHC 34.0 31 - 35 g/dL   RDW 62.3 76.2 - 83.1 %   Platelets 359 150 - 450 x10E3/uL   Neutrophils 53 Not Estab. %   Lymphs 37 Not Estab. %   Monocytes 7 Not Estab. %   Eos 2 Not Estab. %   Basos 1 Not Estab. %   Neutrophils Absolute 4.5 1 - 7 x10E3/uL   Lymphocytes Absolute 3.2 (H) 0 - 3 x10E3/uL   Monocytes Absolute 0.6 0 - 0 x10E3/uL   EOS (ABSOLUTE) 0.1 0.0 - 0.4 x10E3/uL   Basophils Absolute 0.1 0 - 0 x10E3/uL   Immature Granulocytes 0 Not Estab. %   Immature Grans (Abs) 0.0 0.0 - 0.1 x10E3/uL  Comprehensive metabolic panel  Result Value Ref Range   Glucose 87 65 - 99 mg/dL   BUN 15 6 - 24 mg/dL   Creatinine, Ser 5.17 0.57 - 1.00 mg/dL   GFR calc non Af Amer 93 >59 mL/min/1.73   GFR calc Af Amer 107 >59 mL/min/1.73   BUN/Creatinine Ratio 19 9 - 23   Sodium 137 134 - 144 mmol/L   Potassium 4.0 3.5 - 5.2 mmol/L   Chloride 100 96 - 106 mmol/L   CO2 22 20 - 29 mmol/L   Calcium 9.0 8.7 - 10.2 mg/dL   Total Protein 6.4 6.0 - 8.5 g/dL   Albumin 4.1 3.5 - 5.5 g/dL   Globulin, Total 2.3 1.5 - 4.5 g/dL   Albumin/Globulin Ratio 1.8 1.2 - 2.2   Bilirubin Total <0.2 0.0 - 1.2 mg/dL   Alkaline Phosphatase 43 39 - 117 IU/L   AST 17 0 - 40 IU/L   ALT 11 0 - 32 IU/L  Lipid Panel w/o Chol/HDL Ratio  Result Value Ref Range   Cholesterol, Total 204 (H) 100 - 199 mg/dL   Triglycerides 77 0 - 149 mg/dL   HDL 97 >61 mg/dL   VLDL Cholesterol Cal 15 5 - 40 mg/dL   LDL Calculated 92 0 - 99 mg/dL  TSH  Result Value Ref Range   TSH 2.130 0.450 - 4.500 uIU/mL  UA/M w/rflx Culture, Routine   Specimen: Urine   URINE  Result Value Ref Range   Specific Gravity, UA <1.005 (L)  1.005 - 1.030   pH, UA 6.0 5.0 - 7.5   Color, UA Straw Yellow   Appearance Ur Clear Clear   Leukocytes, UA Negative Negative   Protein, UA Negative Negative/Trace   Glucose, UA Negative Negative   Ketones, UA Negative Negative   RBC, UA Negative Negative   Bilirubin, UA Negative Negative   Urobilinogen, Ur 0.2 0.2 - 1.0 mg/dL   Nitrite, UA Negative Negative  HIV Antibody (routine testing w rflx)  Result Value Ref  Range   HIV Screen 4th Generation wRfx Non Reactive Non Reactive      Assessment & Plan:   Problem List Items Addressed This Visit      Other   Nasal congestion    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  Is vaccinated.  She obtained Covid testing this morning and is aware to self quarantine at this time and to self quarantine for 10 days if returns positive.  Recommend she start taking Vitamin D, Zinc, and Vitamin C daily.  May take OTC medications for symptoms relief, Claritin or sinus medications.  Recommend plenty of rest and fluid at home.  Educated on Covid and symptoms.  Return to office for worsening or ongoing symptoms.  She is to alert providers to Covid results.         I discussed the assessment and treatment plan with the patient. The patient was provided an opportunity to ask questions and all were answered. The patient agreed with the plan and demonstrated an understanding of the instructions.   The patient was advised to call back or seek an in-person evaluation if the symptoms worsen or if the condition fails to improve as anticipated.   I provided 21+ minutes of time during this encounter.  Follow up plan: Return if symptoms worsen or fail to improve.

## 2019-10-31 NOTE — Assessment & Plan Note (Signed)
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  Is vaccinated.  She obtained Covid testing this morning and is aware to self quarantine at this time and to self quarantine for 10 days if returns positive.  Recommend she start taking Vitamin D, Zinc, and Vitamin C daily.  May take OTC medications for symptoms relief, Claritin or sinus medications.  Recommend plenty of rest and fluid at home.  Educated on Covid and symptoms.  Return to office for worsening or ongoing symptoms.  She is to alert providers to Covid results.

## 2019-11-02 LAB — SARS-COV-2, NAA 2 DAY TAT

## 2019-11-02 LAB — NOVEL CORONAVIRUS, NAA: SARS-CoV-2, NAA: DETECTED — AB

## 2019-11-02 NOTE — Progress Notes (Signed)
Contacted via MyChart  Good evening, I am sorry this returned positive.  I know nursing with [facility] and the health department reached out.  If you need a letter for work please let me know.  Have a great day.

## 2019-11-05 ENCOUNTER — Other Ambulatory Visit: Payer: Self-pay | Admitting: *Deleted

## 2019-11-05 ENCOUNTER — Telehealth: Payer: Self-pay | Admitting: *Deleted

## 2019-11-05 MED ORDER — AMLODIPINE BESYLATE 2.5 MG PO TABS: 2.5000 mg | ORAL_TABLET | Freq: Every day | ORAL | 0 refills | Status: DC

## 2019-11-05 NOTE — Progress Notes (Unsigned)
Patient request RF of amlodipine

## 2019-11-05 NOTE — Telephone Encounter (Signed)
Patient reports she tested + COVID. Patient got results on Friday. Patient reports good and bad days- she feels that at times she has trouble with her breathing- she feels SOB with activity/in the heat. Overall fatigue.Patient reports cough- not bad. Follow up appointment made.

## 2019-11-07 ENCOUNTER — Telehealth: Payer: BC Managed Care – PPO | Admitting: Family Medicine

## 2019-11-16 ENCOUNTER — Other Ambulatory Visit: Payer: Self-pay | Admitting: Dermatology

## 2019-11-16 DIAGNOSIS — L700 Acne vulgaris: Secondary | ICD-10-CM

## 2019-11-19 ENCOUNTER — Encounter: Payer: BC Managed Care – PPO | Admitting: Family Medicine

## 2019-12-06 ENCOUNTER — Encounter: Payer: BC Managed Care – PPO | Admitting: Family Medicine

## 2020-01-11 ENCOUNTER — Encounter: Payer: BC Managed Care – PPO | Admitting: Family Medicine

## 2020-01-16 ENCOUNTER — Ambulatory Visit: Payer: BC Managed Care – PPO | Admitting: Dermatology

## 2020-01-28 ENCOUNTER — Ambulatory Visit: Payer: Self-pay

## 2020-02-20 ENCOUNTER — Ambulatory Visit
Admission: EM | Admit: 2020-02-20 | Discharge: 2020-02-20 | Disposition: A | Discharge status: Home / Self Care | Payer: BC Managed Care – PPO | Attending: Physician Assistant | Admitting: Physician Assistant

## 2020-02-20 ENCOUNTER — Encounter: Payer: Self-pay | Admitting: Emergency Medicine

## 2020-02-20 ENCOUNTER — Other Ambulatory Visit: Payer: Self-pay

## 2020-02-20 DIAGNOSIS — Z20822 Contact with and (suspected) exposure to covid-19: Secondary | ICD-10-CM | POA: Insufficient documentation

## 2020-02-20 DIAGNOSIS — Z8616 Personal history of COVID-19: Secondary | ICD-10-CM | POA: Diagnosis not present

## 2020-02-20 DIAGNOSIS — J019 Acute sinusitis, unspecified: Secondary | ICD-10-CM | POA: Insufficient documentation

## 2020-02-20 DIAGNOSIS — R059 Cough, unspecified: Secondary | ICD-10-CM | POA: Insufficient documentation

## 2020-02-20 DIAGNOSIS — R0981 Nasal congestion: Secondary | ICD-10-CM | POA: Diagnosis not present

## 2020-02-20 LAB — RESP PANEL BY RT-PCR (FLU A&B, COVID) ARPGX2
Influenza A by PCR: NEGATIVE
Influenza B by PCR: NEGATIVE
SARS Coronavirus 2 by RT PCR: NEGATIVE

## 2020-02-20 MED ORDER — AMOXICILLIN-POT CLAVULANATE 875-125 MG PO TABS: 1.0000 | ORAL_TABLET | Freq: Two times a day (BID) | ORAL | 0 refills | Status: AC

## 2020-02-20 MED ORDER — FLUTICASONE PROPIONATE 50 MCG/ACT NA SUSP: 2.0000 | Freq: Every day | NASAL | 0 refills | Status: DC

## 2020-02-20 NOTE — ED Provider Notes (Signed)
[facility]    CSN: [id number] Arrival date & time: 02/20/20  1103      History   Chief Complaint Chief Complaint  Patient presents with   Cough   Nasal Congestion         HPI Jacqueline Salinas is a 49 y.o. female presenting for 2.5-week history of nasal congestion and cough.  Patient states over the last 2 days she has felt flulike symptoms.  States that she has had some body aches, headaches, increased fatigue and worsening congestion and cough.  She also admits to some sinus pressure and mild pain.  Patient states that she would have moments where she  felt she was getting better and then symptoms would worsen.  Patient denies any known COVID-19 or influenza exposure.  She admits to personal history of COVID-19 4 months ago.  Patient states she has been taking over-the-counter Claritin.  She denies using any cough syrups recently since they make her feel "woozy."  Patient states [facility] did [facility] for [facility] test, but has not received results.  She denies any other complaints today.  HPI  Past Medical History:  Diagnosis Date   Anxiety    COVID-19 10/2019   Depression    Migraine with acute onset aura    Migraines     Patient Active Problem List   Diagnosis Date Noted   Nasal congestion 10/31/2019   Chest wall pain 04/27/2018   Migraine 12/30/2014    History reviewed. No pertinent surgical history.  OB History   No obstetric history on file.      Home Medications    Prior to Admission medications   Medication Sig Start Date End Date Taking? Authorizing Provider  amLODipine (NORVASC) 2.5 MG tablet Take 1 tablet (2.5 mg total) by mouth daily. 11/05/19  Yes Johnson, Megan P, DO  amphetamine-dextroamphetamine (ADDERALL XR) 30 MG 24 hr capsule TK ONE C PO QAM 09/30/17  Yes [provider]  amphetamine-dextroamphetamine (ADDERALL) 10 MG tablet TK 1 T PO QPM 09/30/17  Yes [provider]  buPROPion (WELLBUTRIN SR) 150 MG  12 hr tablet TK 2 TS PO QAM 12/28/14  Yes [provider]  clindamycin (CLEOCIN-T) 1 % lotion Apply topically daily. 05/23/19 05/22/20 Yes Deirdre Evener, MD  clonazePAM (KLONOPIN) 0.5 MG tablet TK SS T PO BID PRN 10/22/14  Yes [provider]  drospirenone-ethinyl estradiol (YAZ,GIANVI,LORYNA) 3-0.02 MG tablet Take 1 tablet by mouth daily.   Yes [provider]    Family History Family History  Problem Relation Age of Onset   Migraines Mother    Dementia Maternal Grandmother    Lung disease Maternal Grandfather    Alzheimer's disease Paternal Grandfather     Social History Social History   Tobacco Use   Smoking status: Never Smoker   Smokeless tobacco: Never Used  Building services engineer Use: Never used  Substance Use Topics   Alcohol use: Yes    Alcohol/week: 0.0 standard drinks    Comment: Rare   Drug use: No     Allergies   Patient has no known allergies.   Review of Systems Review of Systems  Constitutional: Positive for fatigue. Negative for chills, diaphoresis and fever.  HENT: Positive for congestion, rhinorrhea, sinus pressure and sinus pain. Negative for ear pain and sore throat.   Respiratory: Positive for cough. Negative for shortness of breath.   Gastrointestinal: Negative for abdominal pain, nausea and vomiting.  Musculoskeletal: Positive  for myalgias. Negative for arthralgias.  Skin: Negative for rash.  Neurological: Positive for headaches. Negative for weakness.  Hematological: Negative for adenopathy.     Physical Exam Triage Vital Signs ED Triage Vitals  Enc Vitals Group     BP 02/20/20 1121 138/75     Pulse Rate 02/20/20 1121 (!) 120     Resp 02/20/20 1121 18     Temp 02/20/20 1121 98 F (36.7 C)     Temp Source 02/20/20 1121 Oral     SpO2 02/20/20 1121 100 %     Weight 02/20/20 1119 133 lb (60.3 kg)     Height 02/20/20 1119 5\' 6"  (1.676 m)     Head Circumference --      Peak Flow --      Pain Score  02/20/20 1118 0     Pain Loc --      Pain Edu? --      Excl. in GC? --    No data found.  Updated Vital Signs BP 138/75 (BP Location: Right Arm)    Pulse (!) 120    Temp 98 F (36.7 C) (Oral)    Resp 18    Ht 5\' 6"  (1.676 m)    Wt 133 lb (60.3 kg)    SpO2 100%    BMI 21.47 kg/m     Physical Exam Vitals and nursing note reviewed.  Constitutional:      General: She is not in acute distress.    Appearance: Normal appearance. She is ill-appearing. She is not toxic-appearing.  HENT:     Head: Normocephalic and atraumatic.     Right Ear: Tympanic membrane, ear canal and external ear normal.     Left Ear: Tympanic membrane, ear canal and external ear normal.     Nose: Congestion and rhinorrhea present.     Mouth/Throat:     Mouth: Mucous membranes are moist.     Pharynx: Oropharynx is clear. No posterior oropharyngeal erythema.  Eyes:     General: No scleral icterus.       Right eye: No discharge.        Left eye: No discharge.     Conjunctiva/sclera: Conjunctivae normal.  Cardiovascular:     Rate and Rhythm: Regular rhythm. Tachycardia present.     Heart sounds: Normal heart sounds.  Pulmonary:     Effort: Pulmonary effort is normal. No respiratory distress.     Breath sounds: Normal breath sounds. No wheezing, rhonchi or rales.  Musculoskeletal:     Cervical back: Neck supple.  Skin:    General: Skin is dry.  Neurological:     General: No focal deficit present.     Mental Status: She is alert. Mental status is at baseline.     Motor: No weakness.     Gait: Gait normal.  Psychiatric:        Mood and Affect: Mood normal.        Behavior: Behavior normal.        Thought Content: Thought content normal.      UC Treatments / Results  Labs (all labs ordered are listed, but only abnormal results are displayed) Labs Reviewed  RESP PANEL BY RT-PCR (FLU A&B, COVID) [id number]    EKG   Radiology No results found.  Procedures Procedures (including critical care  time)  Medications Ordered in UC Medications - No data to display  Initial Impression / Assessment and Plan / UC Course  I have reviewed the triage  vital signs and the nursing notes.  Pertinent labs & imaging results that were available during my care of the patient were reviewed by me and considered in my medical decision making (see chart for details).    49 year old female presenting for 2-1/2-week history of nasal congestion and cough that is worsened over the past 2 days.  Patient concerned about flulike symptoms.  [facility]  Patient agreed to respiratory panel to assess for possible flu and COVID-19.  Advised patient I will call with results.  Respiratory panel is negative. Discussed results with patient.  Treating patient for suspected acute sinusitis with Augmentin at this time.  Advised to increase rest and fluids.  Advised to follow-up as needed.  Final Clinical Impressions(s) / UC Diagnoses   Final diagnoses:  Upper respiratory tract infection, unspecified type  Nasal congestion  Cough     Discharge Instructions     I will call you with the results of the respiratory panel in the next hour.  If your flu test is positive, we can treat you with Tamiflu and supportive care.  If your Covid test is positive you will need to be isolated for 10 days from symptom onset and continue supportive care with over-the-counter cough/cold medication, increasing rest and fluids as well as Tylenol and/or Motrin for body aches.  If the respiratory panel is negative then we can treat you for suspected sinus infection with an antibiotic.    ED Prescriptions    None     PDMP not reviewed this encounter.   Shirlee Latch, PA-C 02/20/20 1240

## 2020-02-20 NOTE — Discharge Instructions (Addendum)
I will call you with the results of the respiratory panel in the next hour.  If your flu test is positive, we can treat you with Tamiflu and supportive care.  If your Covid test is positive you will need to be isolated for 10 days from symptom onset and continue supportive care with over-the-counter cough/cold medication, increasing rest and fluids as well as Tylenol and/or Motrin for body aches.  If the respiratory panel is negative then we can treat you for suspected sinus infection with an antibiotic.

## 2020-02-20 NOTE — ED Triage Notes (Addendum)
Patient c/o cough, nasal congestion that started 2 weeks ago. She states she had COVID at the end of August. Patient [facility] but has not received the results yet.

## 2020-03-12 LAB — HM PAP SMEAR

## 2020-03-25 ENCOUNTER — Other Ambulatory Visit: Payer: Self-pay

## 2020-03-25 ENCOUNTER — Ambulatory Visit: Payer: Self-pay | Admitting: Dermatology

## 2020-03-25 DIAGNOSIS — L710 Perioral dermatitis: Secondary | ICD-10-CM

## 2020-03-25 MED ORDER — DOXYCYCLINE MONOHYDRATE 100 MG PO TABS: 100.0000 mg | ORAL_TABLET | Freq: Every day | ORAL | 1 refills | Status: DC

## 2020-03-25 MED ORDER — PIMECROLIMUS 1 % EX CREA: TOPICAL_CREAM | CUTANEOUS | 1 refills | Status: DC

## 2020-03-25 NOTE — Progress Notes (Signed)
° °  Follow-Up Visit   Subjective  Jacqueline Salinas is a 50 y.o. female who presents for the following: Rash (Dry, patchy, irritated skin on chin, sometimes with heat. She has used clindamycin lotion which she thinks helps, and she uses Neutrogena moisturizer. She has used doxycycline in the past for other similar skin issues. ). She was using a whitening toothpaste, but stopped using and rash improved some.   The following portions of the chart were reviewed this encounter and updated as appropriate:       Review of Systems:  No other skin or systemic complaints except as noted in HPI or Assessment and Plan.  Objective  Well appearing patient in no apparent distress; mood and affect are within normal limits.  A focused examination was performed including face. Relevant physical exam findings are noted in the Assessment and Plan.  Objective  Right perioral, chin: Small pink papules on the right perioral, chin.   Assessment & Plan  Perioral dermatitis Right perioral, chin  With flare Recommend using toothpaste without fluoride or extra additives, similar to Toms.  Continue Clindamycin lotion qd/bid. Pt has at home. Restart Elidel Cream Apply to AA rash qd/bid prn itch dsp 30g 1Rf. Restart doxycyline 100mg  take 1 po QD with food dsp #30 1Rf.  Doxycycline should be taken with food to prevent nausea. Do not lay down for 30 minutes after taking. Be cautious with sun exposure and use good sun protection while on this medication. Pregnant women should not take this medication.     doxycycline (ADOXA) 100 MG tablet - Right perioral, chin  pimecrolimus (ELIDEL) 1 % cream - Right perioral, chin  Return if symptoms worsen or fail to improve.   I , CMA, am acting as scribe for Cherlyn Labella, MD .  Documentation: I have reviewed the above documentation for accuracy and completeness, and I agree with the above.  Willeen Niece MD

## 2020-03-25 NOTE — Patient Instructions (Addendum)
Perioral dermatitis  Perioral dermatitis is an eruption which is usually located around the mouth and nose.  It can be a rash and/or red bumps.  It occasionally occurs around the eyes.  It may be itchy and may burn.  The exact cause is unknown.  Some types of makeup, moisturizers, dental products, and prescription creams may be partially responsible for the eruption.  Topical steroids such as cortisone creams can temporarily make the rash better but with discontinuation the rash tends to recur and worsen.  If you have been using topical steroids, your dermatologist may need to gradually taper the strength of steroids.  Topical antibiotics, elidel cream, protopic ointment, and oral antiobiotics may be prescribed to treat this condition.  Although perioral dermatitis is not an infection, some antibiotics have anti-inflammatory properties that help it greatly.   Doxycycline should be taken with food to prevent nausea. Do not lay down for 30 minutes after taking. Be cautious with sun exposure and use good sun protection while on this medication. Pregnant women should not take this medication.

## 2020-06-02 ENCOUNTER — Other Ambulatory Visit: Payer: Self-pay | Admitting: Family Medicine

## 2020-06-02 NOTE — Telephone Encounter (Signed)
Please schedule appointment before refill

## 2020-06-02 NOTE — Telephone Encounter (Signed)
  Notes to clinic: review for continued use and refill  Script has expired    Requested Prescriptions  Pending Prescriptions Disp Refills   amLODipine (NORVASC) 2.5 MG tablet [Pharmacy Med Name: AMLODIPINE BESYLATE 2.5MG  TABLETS] 30 tablet 0    Sig: TAKE 1 TABLET(2.5 MG) BY MOUTH DAILY      Cardiovascular:  Calcium Channel Blockers Failed - 06/02/2020 10:16 AM      Failed - Valid encounter within last 6 months    Recent Outpatient Visits           7 months ago Nasal congestion   [facility] [place], [place] T, NP   8 months ago Raynaud's disease without gangrene   [facility], Megan P, DO   9 months ago Discoloration of skin of foot   [facility] [place], [place], [place]   2 years ago Chest wall pain   [facility] [place], [place] T, NP   2 years ago Upper respiratory tract infection, unspecified type   [facility], [place]                Passed - Last BP in normal range    BP Readings from Last 1 Encounters:  02/20/20 138/75

## 2020-06-02 NOTE — Telephone Encounter (Signed)
Called pt to let her know rx has been sent no answer left vm  

## 2020-06-02 NOTE — Telephone Encounter (Signed)
Scheduled tomorrow at 2:40. Pt states she is completely out

## 2020-06-03 ENCOUNTER — Encounter: Payer: Self-pay | Admitting: Family Medicine

## 2020-06-03 ENCOUNTER — Other Ambulatory Visit: Payer: Self-pay

## 2020-06-03 ENCOUNTER — Ambulatory Visit: Payer: BC Managed Care – PPO | Admitting: Family Medicine

## 2020-06-03 VITALS — BP 139/84 | HR 89 | Temp 97.8°F | Wt 133.0 lb

## 2020-06-03 DIAGNOSIS — I7300 Raynaud's syndrome without gangrene: Secondary | ICD-10-CM

## 2020-06-03 DIAGNOSIS — J069 Acute upper respiratory infection, unspecified: Secondary | ICD-10-CM

## 2020-06-03 MED ORDER — AMLODIPINE BESYLATE 2.5 MG PO TABS: ORAL_TABLET | ORAL | 1 refills | Status: DC

## 2020-06-03 NOTE — Assessment & Plan Note (Signed)
Under good control on current regimen. Continue current regimen. Continue to monitor. Call with any concerns. Refills given.   

## 2020-06-03 NOTE — Progress Notes (Signed)
BP 139/84   Pulse 89   Temp 97.8 F (36.6 C)   Wt 133 lb (60.3 kg)   SpO2 99%   BMI 21.47 kg/m    Subjective:    Patient ID: Jacqueline Salinas, female    DOB: 06/09/1970, 50 y.o.   MRN: [id number]  HPI: Jacqueline Salinas is a 50 y.o. female  Chief Complaint  Patient presents with  . raynauds disease    Follow up   . Cough    Patients states she has been coughing for 4 days, had ST last night, and runny nose.    UPPER RESPIRATORY TRACT INFECTION Worst symptom: sore throat Duration: 4 days Fever: no Cough: no Shortness of breath: no Wheezing: no Chest pain: no Chest tightness: no Chest congestion: no Nasal congestion: yes Runny nose: yes Post nasal drip: yes Sneezing: yes Sore throat: yes Swollen glands: no Sinus pressure: no Headache: no Face pain: no Toothache: no Ear pain: no  Ear pressure: no  Eyes red/itching:no Eye drainage/crusting: no  Vomiting: no Rash: no Fatigue: yes Sick contacts: no Strep contacts: no  Context: stable Recurrent sinusitis: no Relief with OTC cold/cough medications: no  Treatments attempted: anti-histamine and pseudoephedrine   Raynaud's is doing OK. Amlodipine is helping. No getting dizzy. Feeling well.   Relevant past medical, surgical, family and social history reviewed and updated as indicated. Interim medical history since our last visit reviewed. Allergies and medications reviewed and updated.  Review of Systems  Constitutional: Positive for fatigue. Negative for activity change, appetite change, chills, diaphoresis, fever and unexpected weight change.  HENT: Positive for congestion, postnasal drip and rhinorrhea. Negative for dental problem, drooling, ear discharge, ear pain, facial swelling, hearing loss, mouth sores, nosebleeds, sinus pressure, sinus pain, sneezing, sore throat, tinnitus, trouble swallowing and voice change.   Respiratory: Negative.   Cardiovascular: Negative.   Gastrointestinal: Negative.    Musculoskeletal: Negative.   Psychiatric/Behavioral: Negative.     Per HPI unless specifically indicated above     Objective:    BP 139/84   Pulse 89   Temp 97.8 F (36.6 C)   Wt 133 lb (60.3 kg)   SpO2 99%   BMI 21.47 kg/m   Wt Readings from Last 3 Encounters:  06/03/20 133 lb (60.3 kg)  02/20/20 133 lb (60.3 kg)  10/05/19 132 lb 3.2 oz (60 kg)    Physical Exam Vitals and nursing note reviewed.  Constitutional:      General: She is not in acute distress.    Appearance: Normal appearance. She is normal weight. She is not ill-appearing, toxic-appearing or diaphoretic.  HENT:     Head: Normocephalic and atraumatic.     Right Ear: Tympanic membrane, ear canal and external ear normal.     Left Ear: Tympanic membrane, ear canal and external ear normal.     Nose: Congestion and rhinorrhea present.     Mouth/Throat:     Mouth: Mucous membranes are moist.     Pharynx: Oropharynx is clear. No posterior oropharyngeal erythema.  Eyes:     General: No scleral icterus.       Right eye: No discharge.        Left eye: No discharge.     Extraocular Movements: Extraocular movements intact.     Conjunctiva/sclera: Conjunctivae normal.     Pupils: Pupils are equal, round, and reactive to light.  Cardiovascular:     Rate and Rhythm: Normal rate and regular rhythm.     Pulses:  Normal pulses.     Heart sounds: Normal heart sounds. No murmur heard. No friction rub. No gallop.   Pulmonary:     Effort: Pulmonary effort is normal. No respiratory distress.     Breath sounds: Normal breath sounds. No stridor. No wheezing, rhonchi or rales.  Chest:     Chest wall: No tenderness.  Musculoskeletal:        General: Normal range of motion.     Cervical back: Normal range of motion and neck supple.  Skin:    General: Skin is warm and dry.     Capillary Refill: Capillary refill takes less than 2 seconds.     Coloration: Skin is not jaundiced or pale.     Findings: No bruising, erythema,  lesion or rash.  Neurological:     General: No focal deficit present.     Mental Status: She is alert and oriented to person, place, and time. Mental status is at baseline.  Psychiatric:        Mood and Affect: Mood normal.        Behavior: Behavior normal.        Thought Content: Thought content normal.        Judgment: Judgment normal.     Results for orders placed or performed during the hospital encounter of 02/20/20  Resp Panel by RT-PCR (Flu A&B, Covid) Nasopharyngeal Swab   Specimen: Nasopharyngeal Swab; Nasopharyngeal(NP) swabs in vial transport medium  Result Value Ref Range   SARS Coronavirus 2 by RT PCR NEGATIVE NEGATIVE   Influenza A by PCR NEGATIVE NEGATIVE   Influenza B by PCR NEGATIVE NEGATIVE      Assessment & Plan:   Problem List Items Addressed This Visit      Cardiovascular and Mediastinum   Raynaud's disease without gangrene - Primary    Under good control on current regimen. Continue current regimen. Continue to monitor. Call with any concerns. Refills given.        Relevant Medications   amLODipine (NORVASC) 2.5 MG tablet    Other Visit Diagnoses    Viral upper respiratory tract infection       Likely virus vs allergies. Discussed OTC allergy meds. Call if not getting better or getting worse. Continue to mointor.       Follow up plan: Return over the summer for physical.

## 2020-07-17 ENCOUNTER — Other Ambulatory Visit: Payer: Self-pay | Admitting: Dermatology

## 2020-07-17 DIAGNOSIS — L710 Perioral dermatitis: Secondary | ICD-10-CM

## 2020-07-31 IMAGING — DX DG CHEST 2V
2 series · 2 of 2 positions shown · non-contrast
Comparison: None.

CLINICAL DATA: Chest pain.

EXAM:
CHEST - 2 VIEW

[chest pa]
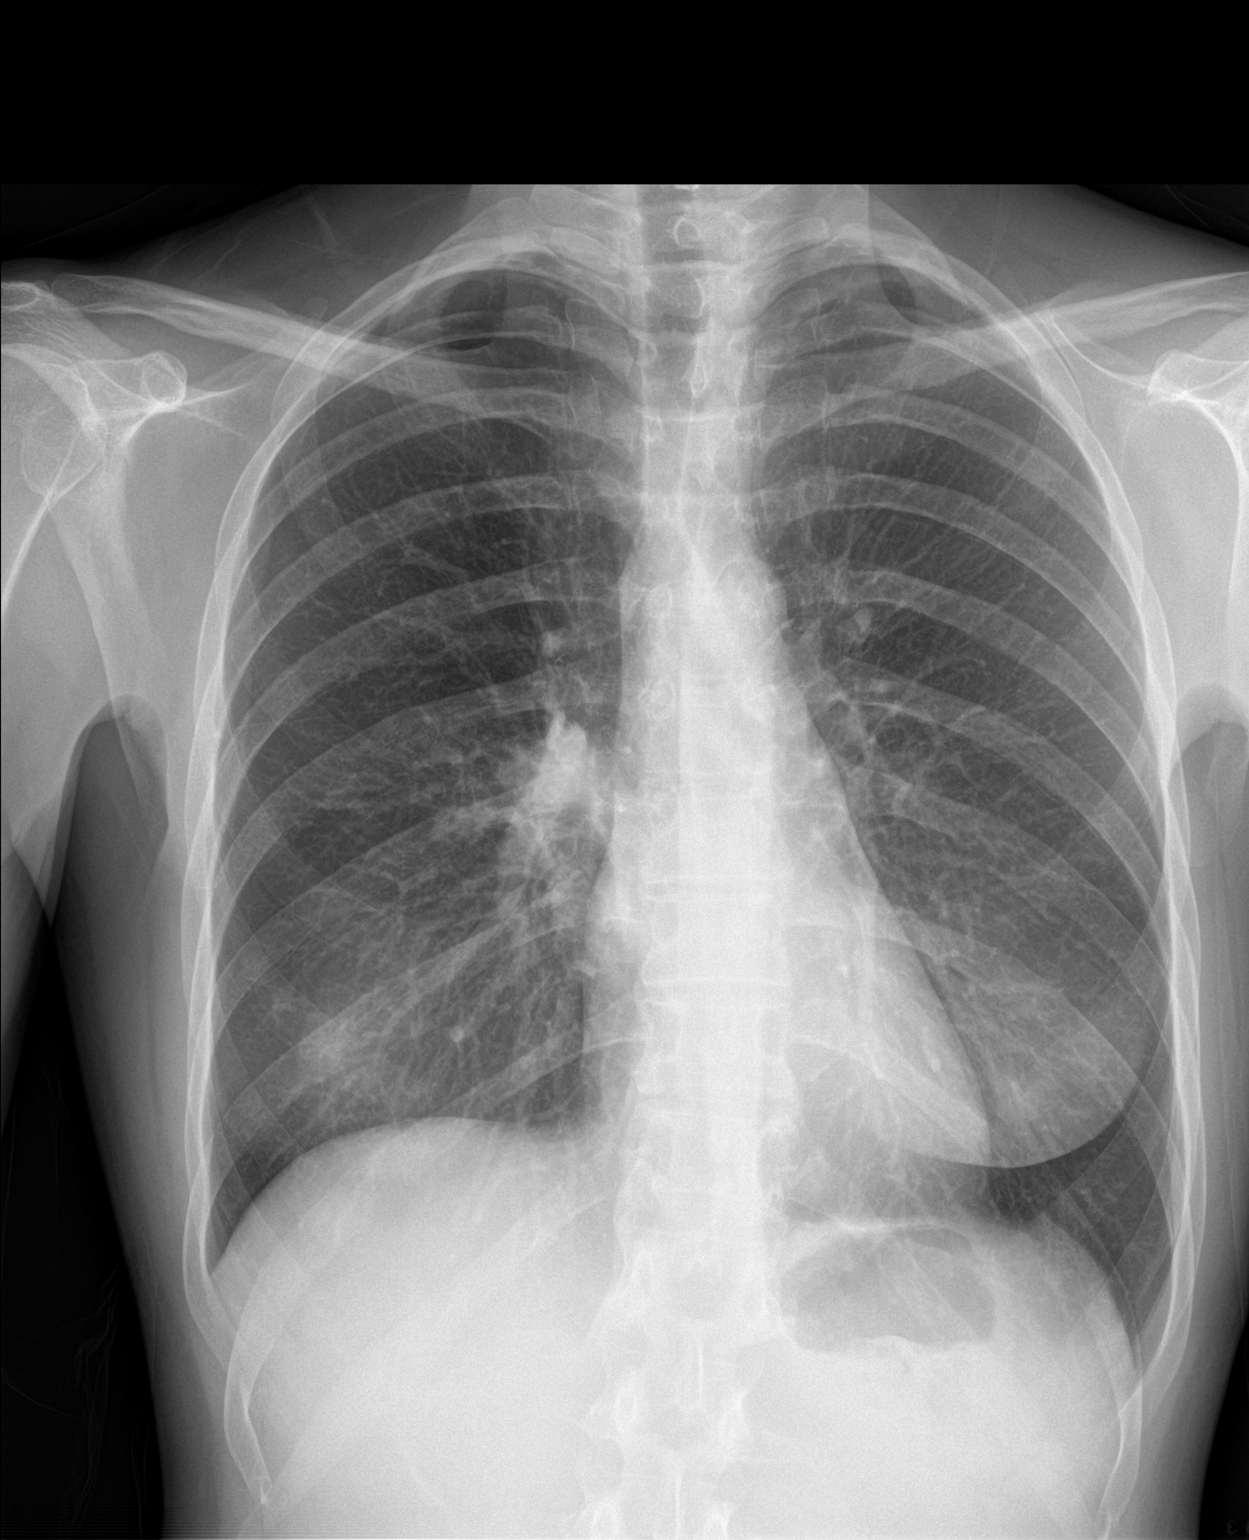

[chest lat]
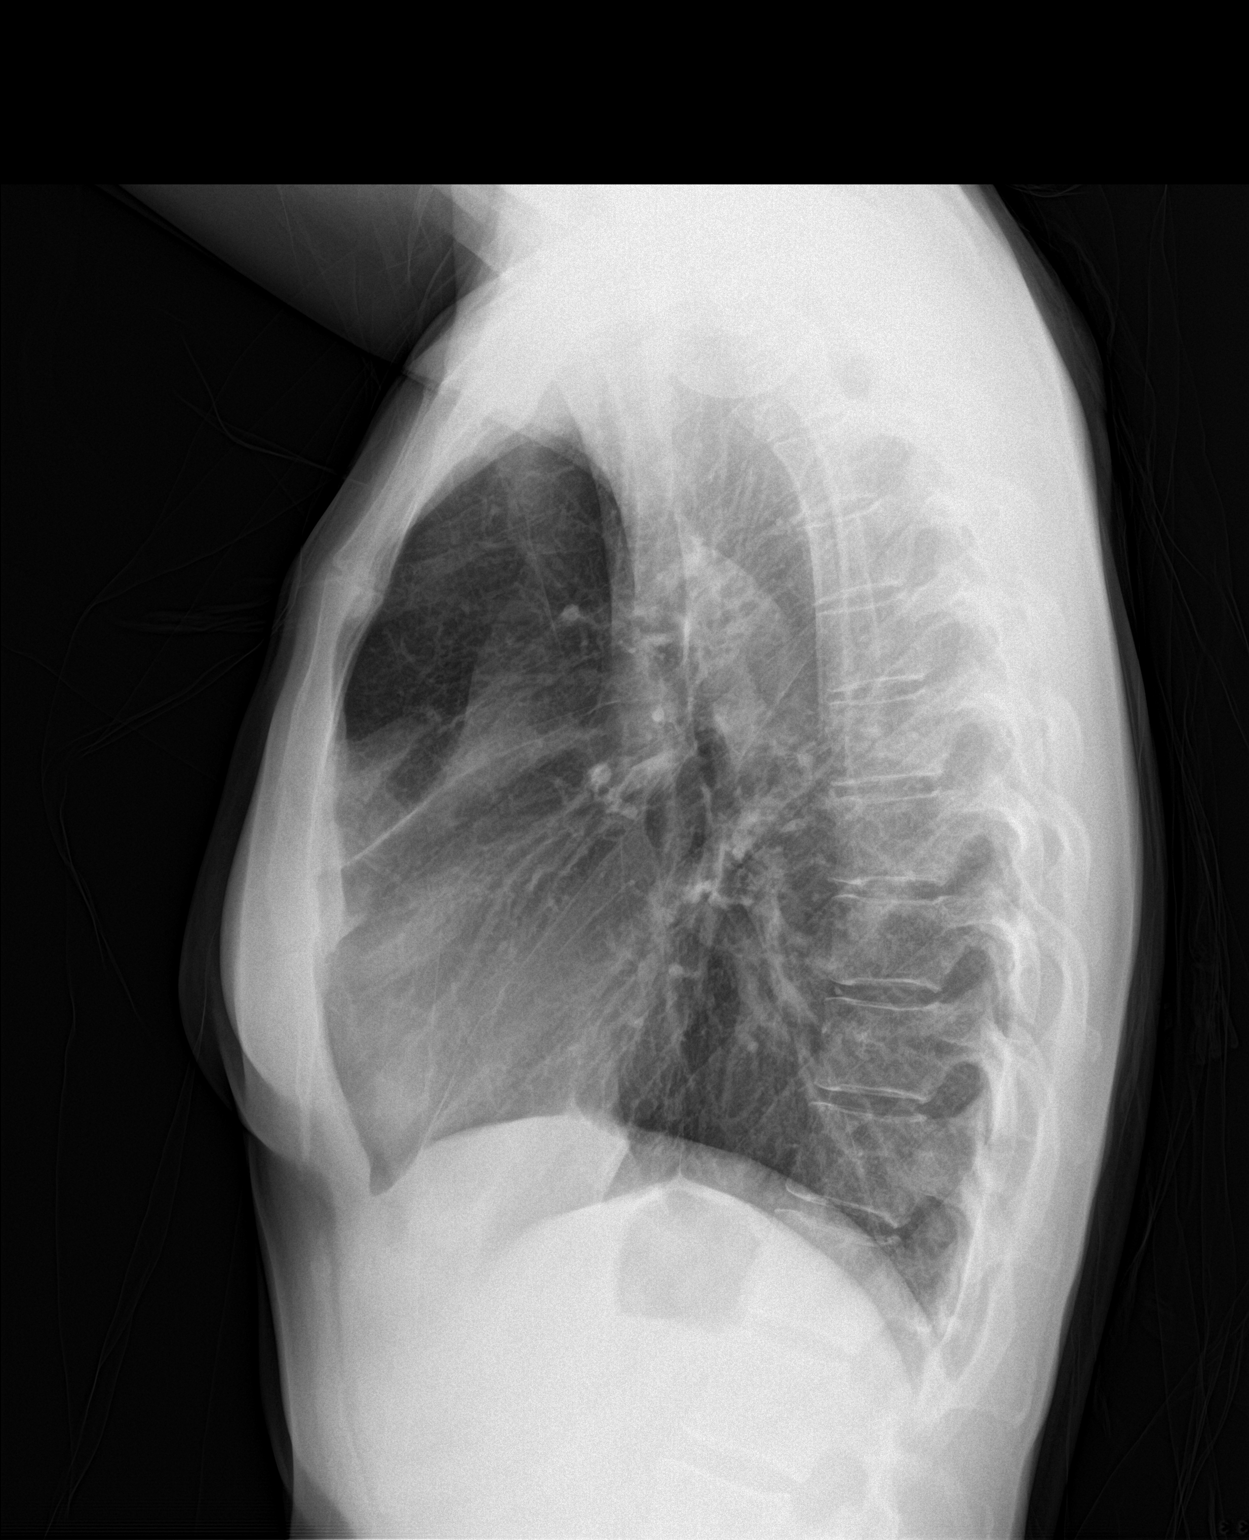

[2 of 2 positions shown; findings below may reference images not displayed]

FINDINGS: The heart size and mediastinal contours are within normal limits. No
pneumothorax or pleural effusion is noted. Lungs are clear.. The
visualized skeletal structures are unremarkable.
IMPRESSION: No active cardiopulmonary disease.

## 2020-09-04 ENCOUNTER — Encounter: Payer: Self-pay | Admitting: Family Medicine

## 2020-09-04 ENCOUNTER — Other Ambulatory Visit: Payer: Self-pay

## 2020-09-04 ENCOUNTER — Ambulatory Visit (INDEPENDENT_AMBULATORY_CARE_PROVIDER_SITE_OTHER): Payer: BC Managed Care – PPO | Admitting: Family Medicine

## 2020-09-04 VITALS — BP 129/85 | HR 108 | Temp 98.1°F | Ht 65.0 in | Wt 129.2 lb

## 2020-09-04 DIAGNOSIS — I7300 Raynaud's syndrome without gangrene: Secondary | ICD-10-CM

## 2020-09-04 DIAGNOSIS — Z1211 Encounter for screening for malignant neoplasm of colon: Secondary | ICD-10-CM

## 2020-09-04 DIAGNOSIS — Z0000 Encounter for general adult medical examination without abnormal findings: Secondary | ICD-10-CM

## 2020-09-04 LAB — URINALYSIS, ROUTINE W REFLEX MICROSCOPIC
Bilirubin, UA: NEGATIVE
Glucose, UA: NEGATIVE
Ketones, UA: NEGATIVE
Leukocytes,UA: NEGATIVE
Nitrite, UA: NEGATIVE
Protein,UA: NEGATIVE
RBC, UA: NEGATIVE
Specific Gravity, UA: 1.005 (ref 1.005–1.030)
Urobilinogen, Ur: 0.2 mg/dL (ref 0.2–1.0)
pH, UA: 6 (ref 5.0–7.5)

## 2020-09-04 MED ORDER — AMLODIPINE BESYLATE 2.5 MG PO TABS: ORAL_TABLET | ORAL | 3 refills | Status: DC

## 2020-09-04 NOTE — Assessment & Plan Note (Signed)
Under good control on current regimen. Continue current regimen. Continue to monitor. Call with any concerns. Refills given.   

## 2020-09-04 NOTE — Progress Notes (Signed)
BP 129/85   Pulse (!) 108   Temp 98.1 F (36.7 C)   Ht 5\' 5"  (1.651 m)   Wt 129 lb 3.2 oz (58.6 kg)   SpO2 100%   BMI 21.50 kg/m    Subjective:    Patient ID: , female    DOB: 01/25/1971, 50 y.o.   MRN: [id number]  HPI: Jacqueline Salinas is a 50 y.o. female presenting on 09/04/2020 for comprehensive medical examination. Current medical complaints include:  Raynaud's doing well. Tolerating amlodipine. No concerns.  Menopausal Symptoms: no  Depression Screen done today and results listed below:  Depression screen [facility] 2/9 09/04/2020 12/22/2017  Decreased Interest 0 1  Down, Depressed, Hopeless 1 1  PHQ - 2 Score 1 2  Altered sleeping 0 0  Tired, decreased energy 0 2  Change in appetite 0 0  Feeling bad or failure about yourself  0 0  Trouble concentrating 0 0  Moving slowly or fidgety/restless 0 0  Suicidal thoughts 0 0  PHQ-9 Score 1 4  Difficult doing work/chores Not difficult at all Not difficult at all    Past Medical History:  Past Medical History:  Diagnosis Date   Anxiety    COVID-19 10/2019   Depression    Migraine with acute onset aura    Migraines     Surgical History:  History reviewed. No pertinent surgical history.  Medications:  Current Outpatient Medications on File Prior to Visit  Medication Sig   amphetamine-dextroamphetamine (ADDERALL XR) 30 MG 24 hr capsule TK ONE C PO QAM   amphetamine-dextroamphetamine (ADDERALL) 10 MG tablet TK 1 T PO QPM   buPROPion (WELLBUTRIN SR) 100 MG 12 hr tablet Take 100 mg by mouth 2 (two) times daily.   Cholecalciferol (VITAMIN D3) 1.25 MG (50000 UT) CAPS Take 1 capsule by mouth once a week.   clonazePAM (KLONOPIN) 0.5 MG tablet TK SS T PO BID PRN   drospirenone-ethinyl estradiol (YAZ,GIANVI,LORYNA) 3-0.02 MG tablet Take 1 tablet by mouth daily.   No current facility-administered medications on file prior to visit.    Allergies:  No Known Allergies  Social History:  Social History    Socioeconomic History   Marital status: Married    Spouse name: Not on file   Number of children: Not on file   Years of education: Not on file   Highest education level: Not on file  Occupational History   Not on file  Tobacco Use   Smoking status: Never   Smokeless tobacco: Never  Vaping Use   Vaping Use: Never used  Substance and Sexual Activity   Alcohol use: Yes    Alcohol/week: 0.0 standard drinks    Comment: Rare   Drug use: No   Sexual activity: Yes    Birth control/protection: Pill  Other Topics Concern   Not on file  Social History Narrative   Not on file   Social Determinants of Health   Financial Resource Strain: Not on file  Food Insecurity: Not on file  Transportation Needs: Not on file  Physical Activity: Not on file  Stress: Not on file  Social Connections: Not on file  Intimate Partner Violence: Not on file   Social History   Tobacco Use  Smoking Status Never  Smokeless Tobacco Never   Social History   Substance and Sexual Activity  Alcohol Use Yes   Alcohol/week: 0.0 standard drinks   Comment: Rare    Family History:  Family History  Problem Relation  Age of Onset   Migraines Mother    Dementia Maternal Grandmother    Lung disease Maternal Grandfather    Alzheimer's disease Paternal Grandfather     Past medical history, surgical history, medications, allergies, family history and social history reviewed with patient today and changes made to appropriate areas of the chart.   Review of Systems  Constitutional: Negative.   HENT: Negative.    Eyes: Negative.   Respiratory: Negative.    Cardiovascular: Negative.   Gastrointestinal: Negative.   Genitourinary: Negative.   Musculoskeletal: Negative.   Skin: Negative.   Neurological: Negative.   Endo/Heme/Allergies: Negative.   Psychiatric/Behavioral: Negative.    All other ROS negative except what is listed above and in the HPI.      Objective:    BP 129/85   Pulse (!) 108    Temp 98.1 F (36.7 C)   Ht 5\' 5"  (1.651 m)   Wt 129 lb 3.2 oz (58.6 kg)   SpO2 100%   BMI 21.50 kg/m   Wt Readings from Last 3 Encounters:  09/04/20 129 lb 3.2 oz (58.6 kg)  06/03/20 133 lb (60.3 kg)  02/20/20 133 lb (60.3 kg)    Physical Exam Vitals and nursing note reviewed.  Constitutional:      General: She is not in acute distress.    Appearance: Normal appearance. She is not ill-appearing, toxic-appearing or diaphoretic.  HENT:     Head: Normocephalic and atraumatic.     Right Ear: Tympanic membrane, ear canal and external ear normal. There is no impacted cerumen.     Left Ear: Tympanic membrane, ear canal and external ear normal. There is no impacted cerumen.     Nose: Nose normal. No congestion or rhinorrhea.     Mouth/Throat:     Mouth: Mucous membranes are moist.     Pharynx: Oropharynx is clear. No oropharyngeal exudate or posterior oropharyngeal erythema.  Eyes:     General: No scleral icterus.       Right eye: No discharge.        Left eye: No discharge.     Extraocular Movements: Extraocular movements intact.     Conjunctiva/sclera: Conjunctivae normal.     Pupils: Pupils are equal, round, and reactive to light.  Neck:     Vascular: No carotid bruit.  Cardiovascular:     Rate and Rhythm: Normal rate and regular rhythm.     Pulses: Normal pulses.     Heart sounds: No murmur heard.   No friction rub. No gallop.  Pulmonary:     Effort: Pulmonary effort is normal. No respiratory distress.     Breath sounds: Normal breath sounds. No stridor. No wheezing, rhonchi or rales.  Chest:     Chest wall: No tenderness.  Abdominal:     General: Abdomen is flat. Bowel sounds are normal. There is no distension.     Palpations: Abdomen is soft. There is no mass.     Tenderness: There is no abdominal tenderness. There is no right CVA tenderness, left CVA tenderness, guarding or rebound.     Hernia: No hernia is present.  Genitourinary:    Comments: Breast and pelvic  exams deferred with shared decision making Musculoskeletal:        General: No swelling, tenderness, deformity or signs of injury.     Cervical back: Normal range of motion and neck supple. No rigidity. No muscular tenderness.     Right lower leg: No edema.  Left lower leg: No edema.  Lymphadenopathy:     Cervical: No cervical adenopathy.  Skin:    General: Skin is warm and dry.     Capillary Refill: Capillary refill takes less than 2 seconds.     Coloration: Skin is not jaundiced or pale.     Findings: No bruising, erythema, lesion or rash.  Neurological:     General: No focal deficit present.     Mental Status: She is alert and oriented to person, place, and time. Mental status is at baseline.     Cranial Nerves: No cranial nerve deficit.     Sensory: No sensory deficit.     Motor: No weakness.     Coordination: Coordination normal.     Gait: Gait normal.     Deep Tendon Reflexes: Reflexes normal.  Psychiatric:        Mood and Affect: Mood normal.        Behavior: Behavior normal.        Thought Content: Thought content normal.        Judgment: Judgment normal.    Results for orders placed or performed during the hospital encounter of 02/20/20  Resp Panel by RT-PCR (Flu A&B, Covid) Nasopharyngeal Swab   Specimen: Nasopharyngeal Swab; Nasopharyngeal(NP) swabs in vial transport medium  Result Value Ref Range   SARS Coronavirus 2 by RT PCR NEGATIVE NEGATIVE   Influenza A by PCR NEGATIVE NEGATIVE   Influenza B by PCR NEGATIVE NEGATIVE      Assessment & Plan:   Problem List Items Addressed This Visit       Cardiovascular and Mediastinum   Raynaud's disease without gangrene    Under good control on current regimen. Continue current regimen. Continue to monitor. Call with any concerns. Refills given.         Relevant Medications   amLODipine (NORVASC) 2.5 MG tablet   Other Visit Diagnoses     Routine general medical examination at a health care facility    -   Primary   Vaccines up to date. Screening labs checked today. Pap and mammo through GYN. Colonoguard ordered today. Continue diet and exercise. Call with concerns.    Relevant Orders   CBC with Differential/Platelet   Comprehensive metabolic panel   Lipid Panel w/o Chol/HDL Ratio   Urinalysis, Routine w reflex microscopic   TSH   Hepatitis C Antibody   Screening for colon cancer       Cologuard ordered today.   Relevant Orders   Cologuard        Follow up plan: Return in about 1 year (around 09/04/2021) for physical, [facility]   LABORATORY TESTING:  - Pap smear: done elsewhere  IMMUNIZATIONS:   - Tdap: Tetanus vaccination status reviewed: last tetanus booster within 10 years. - Influenza: Up to date - Pneumovax: Not applicable - COVID: Not applicable  SCREENING: -Mammogram: Up to date  - Colonoscopy: cologuard ordered today   PATIENT COUNSELING:   Advised to take 1 mg of folate supplement per day if capable of pregnancy.   Sexuality: Discussed sexually transmitted diseases, partner selection, use of condoms, avoidance of unintended pregnancy  and contraceptive alternatives.   Advised to avoid cigarette smoking.  I discussed with the patient that most people either abstain from alcohol or drink within safe limits (<=14/week and <=4 drinks/occasion for males, <=7/weeks and <= 3 drinks/occasion for females) and that the risk for alcohol disorders and other health effects  rises proportionally with the number of drinks per week and how often a drinker exceeds daily limits.  Discussed cessation/primary prevention of drug use and availability of treatment for abuse.   Diet: Encouraged to adjust caloric intake to maintain  or achieve ideal body weight, to reduce intake of dietary saturated fat and total fat, to limit sodium intake by avoiding high sodium foods and not adding table salt, and to maintain adequate dietary potassium and  calcium preferably from fresh fruits, vegetables, and low-fat dairy products.    stressed the importance of regular exercise  Injury prevention: Discussed safety belts, safety helmets, smoke detector, smoking near bedding or upholstery.   Dental health: Discussed importance of regular tooth brushing, flossing, and dental visits.    NEXT PREVENTATIVE PHYSICAL DUE IN 1 YEAR. Return in about 1 year (around 09/04/2021) for physical, records release from [facility] for pap and mammo.

## 2020-09-05 LAB — CBC WITH DIFFERENTIAL/PLATELET
Basophils Absolute: 0.1 10*3/uL (ref 0.0–0.2)
Basos: 1 %
EOS (ABSOLUTE): 0.1 10*3/uL (ref 0.0–0.4)
Eos: 1 %
Hematocrit: 44 % (ref 34.0–46.6)
Hemoglobin: 15 g/dL (ref 11.1–15.9)
Immature Grans (Abs): 0 10*3/uL (ref 0.0–0.1)
Immature Granulocytes: 0 %
Lymphocytes Absolute: 3.6 10*3/uL — ABNORMAL HIGH (ref 0.7–3.1)
Lymphs: 33 %
MCH: 30.7 pg (ref 26.6–33.0)
MCHC: 34.1 g/dL (ref 31.5–35.7)
MCV: 90 fL (ref 79–97)
Monocytes Absolute: 0.6 10*3/uL (ref 0.1–0.9)
Monocytes: 5 %
Neutrophils Absolute: 6.7 10*3/uL (ref 1.4–7.0)
Neutrophils: 60 %
Platelets: 388 10*3/uL (ref 150–450)
RBC: 4.89 x10E6/uL (ref 3.77–5.28)
RDW: 12.8 % (ref 11.7–15.4)
WBC: 11 10*3/uL — ABNORMAL HIGH (ref 3.4–10.8)

## 2020-09-05 LAB — COMPREHENSIVE METABOLIC PANEL
ALT: 16 IU/L (ref 0–32)
AST: 26 IU/L (ref 0–40)
Albumin/Globulin Ratio: 1.9 (ref 1.2–2.2)
Albumin: 4.7 g/dL (ref 3.8–4.8)
Alkaline Phosphatase: 62 IU/L (ref 44–121)
BUN/Creatinine Ratio: 14 (ref 9–23)
BUN: 14 mg/dL (ref 6–24)
Bilirubin Total: 0.2 mg/dL (ref 0.0–1.2)
CO2: 21 mmol/L (ref 20–29)
Calcium: 9.6 mg/dL (ref 8.7–10.2)
Chloride: 99 mmol/L (ref 96–106)
Creatinine, Ser: 0.97 mg/dL (ref 0.57–1.00)
Globulin, Total: 2.5 g/dL (ref 1.5–4.5)
Glucose: 84 mg/dL (ref 65–99)
Potassium: 4 mmol/L (ref 3.5–5.2)
Sodium: 135 mmol/L (ref 134–144)
Total Protein: 7.2 g/dL (ref 6.0–8.5)
eGFR: 72 mL/min/{1.73_m2} (ref >59)

## 2020-09-05 LAB — LIPID PANEL W/O CHOL/HDL RATIO
Cholesterol, Total: 234 mg/dL — ABNORMAL HIGH (ref 100–199)
HDL: 102 mg/dL (ref >39)
LDL Chol Calc (NIH): 106 mg/dL — ABNORMAL HIGH (ref 0–99)
Triglycerides: 159 mg/dL — ABNORMAL HIGH (ref 0–149)
VLDL Cholesterol Cal: 26 mg/dL (ref 5–40)

## 2020-09-05 LAB — TSH: TSH: 2.71 u[IU]/mL (ref 0.450–4.500)

## 2020-09-05 LAB — HEPATITIS C ANTIBODY: Hep C Virus Ab: 0.1 s/co ratio (ref 0.0–0.9)

## 2020-12-28 ENCOUNTER — Ambulatory Visit
Admission: EM | Admit: 2020-12-28 | Discharge: 2020-12-28 | Disposition: A | Discharge status: Home / Self Care | Payer: BC Managed Care – PPO | Attending: Internal Medicine | Admitting: Internal Medicine

## 2020-12-28 ENCOUNTER — Other Ambulatory Visit: Payer: Self-pay

## 2020-12-28 DIAGNOSIS — B9689 Other specified bacterial agents as the cause of diseases classified elsewhere: Secondary | ICD-10-CM | POA: Diagnosis not present

## 2020-12-28 DIAGNOSIS — J019 Acute sinusitis, unspecified: Secondary | ICD-10-CM | POA: Diagnosis not present

## 2020-12-28 MED ORDER — GUAIFENESIN ER 600 MG PO TB12: 600.0000 mg | ORAL_TABLET | Freq: Two times a day (BID) | ORAL | Status: DC

## 2020-12-28 MED ORDER — BENZONATATE 100 MG PO CAPS: 100.0000 mg | ORAL_CAPSULE | Freq: Three times a day (TID) | ORAL | 0 refills | Status: DC | PRN

## 2020-12-28 MED ORDER — FLUTICASONE PROPIONATE 50 MCG/ACT NA SUSP: 1.0000 | Freq: Every day | NASAL | 0 refills | Status: DC

## 2020-12-28 MED ORDER — AMOXICILLIN-POT CLAVULANATE 875-125 MG PO TABS: 1.0000 | ORAL_TABLET | Freq: Two times a day (BID) | ORAL | 0 refills | Status: DC

## 2020-12-28 NOTE — ED Provider Notes (Signed)
[facility]    CSN: [id number] Arrival date & time: 12/28/20  1011      History   Chief Complaint Chief Complaint  Patient presents with   Cough   Head congestion    HPI Jacqueline Salinas is a 50 y.o. female comes to the urgent care with 2-week history of worsening nasal congestion and nonproductive cough.  Symptoms started a couple of weeks ago and has been worsening.  Nasal discharge was initially clear but over the past few days its become yellowish-green.  No fever or chills.  Patient endorses postnasal drainage.  No vomiting or diarrhea.  No shortness of breath or wheezing.  Patient has some sinus pressure and pain.  She denies fever.  She continues to use over-the-counter allergy medication with no relief.   HPI  Past Medical History:  Diagnosis Date   Anxiety    COVID-19 10/2019   Depression    Migraine with acute onset aura    Migraines     Patient Active Problem List   Diagnosis Date Noted   Raynaud's disease without gangrene 06/03/2020   Migraine 12/30/2014    History reviewed. No pertinent surgical history.  OB History   No obstetric history on file.      Home Medications    Prior to Admission medications   Medication Sig Start Date End Date Taking? Authorizing Provider  amLODipine (NORVASC) 2.5 MG tablet TAKE 1 TABLET(2.5 MG) BY MOUTH DAILY 09/04/20  Yes Johnson, Megan P, DO  amoxicillin-clavulanate (AUGMENTIN) 875-125 MG tablet Take 1 tablet by mouth every 12 (twelve) hours. 12/28/20  Yes Zyeir Dymek, Britta Mccreedy, MD  amphetamine-dextroamphetamine (ADDERALL XR) 30 MG 24 hr capsule TK ONE C PO QAM 09/30/17  Yes [provider]  amphetamine-dextroamphetamine (ADDERALL) 10 MG tablet TK 1 T PO QPM 09/30/17  Yes [provider]  benzonatate (TESSALON) 100 MG capsule Take 1 capsule (100 mg total) by mouth 3 (three) times daily as needed for cough. 12/28/20  Yes Adelaide Pfefferkorn, Britta Mccreedy, MD  buPROPion [facility] SR) 100 MG 12 hr tablet Take 100  mg by mouth 2 (two) times daily. 06/24/20  Yes [provider]  Cholecalciferol (VITAMIN D3) 1.25 MG (50000 UT) CAPS Take 1 capsule by mouth once a week. 03/25/20  Yes [provider]  clonazePAM (KLONOPIN) 0.5 MG tablet TK SS T PO BID PRN 10/22/14  Yes [provider]  drospirenone-ethinyl estradiol (YAZ,GIANVI,LORYNA) 3-0.02 MG tablet Take 1 tablet by mouth daily.   Yes [provider]  fluticasone (FLONASE) 50 MCG/ACT nasal spray Place 1 spray into both nostrils daily. 12/28/20  Yes Cezar Misiaszek, Britta Mccreedy, MD  guaiFENesin (MUCINEX) 600 MG 12 hr tablet Take 1 tablet (600 mg total) by mouth 2 (two) times daily. 12/28/20  Yes Reatha Sur, Britta Mccreedy, MD    Family History Family History  Problem Relation Age of Onset   Migraines Mother    Dementia Maternal Grandmother    Lung disease Maternal Grandfather    Alzheimer's disease Paternal Grandfather     Social History Social History   Tobacco Use   Smoking status: Never   Smokeless tobacco: Never  Vaping Use   Vaping Use: Never used  Substance Use Topics   Alcohol use: Yes    Alcohol/week: 0.0 standard drinks    Comment: Rare   Drug use: No     Allergies   Patient has no known allergies.   Review of Systems Review of Systems  Constitutional: Negative.   HENT:  Positive for congestion, sinus pressure and sinus pain. Negative for sore throat.   Respiratory: Negative.    Gastrointestinal: Negative.   Neurological: Negative.     Physical Exam Triage Vital Signs ED Triage Vitals  Enc Vitals Group     BP 12/28/20 1108 (!) 129/95     Pulse Rate 12/28/20 1108 (!) 130     Resp 12/28/20 1108 18     Temp 12/28/20 1108 98.3 F (36.8 C)     Temp Source 12/28/20 1108 Oral     SpO2 12/28/20 1108 100 %     Weight 12/28/20 1107 133 lb (60.3 kg)     Height 12/28/20 1107 5\' 6"  (1.676 m)     Head Circumference --      Peak Flow --      Pain Score 12/28/20 1107 7     Pain Loc --      Pain Edu? --       Excl. in GC? --    No data found.  Updated Vital Signs BP (!) 129/95 (BP Location: Left Arm)   Pulse (!) 130 Comment: Took ADHD medication and makes heart race fast, no palpatations  Temp 98.3 F (36.8 C) (Oral)   Resp 18   Ht 5\' 6"  (1.676 m)   Wt 60.3 kg   LMP 12/14/2020   SpO2 100%   BMI 21.47 kg/m   Visual Acuity Right Eye Distance:   Left Eye Distance:   Bilateral Distance:    Right Eye Near:   Left Eye Near:    Bilateral Near:     Physical Exam Vitals and nursing note reviewed.  Constitutional:      General: She is not in acute distress.    Appearance: She is not ill-appearing.  HENT:     Right Ear: Tympanic membrane normal.     Left Ear: Tympanic membrane normal.     Mouth/Throat:     Mouth: Mucous membranes are moist.     Pharynx: No oropharyngeal exudate or posterior oropharyngeal erythema.  Cardiovascular:     Rate and Rhythm: Normal rate and regular rhythm.  Abdominal:     General: Bowel sounds are normal.     Palpations: Abdomen is soft.  Musculoskeletal:        General: Normal range of motion.  Neurological:     Mental Status: She is alert.     UC Treatments / Results  Labs (all labs ordered are listed, but only abnormal results are displayed) Labs Reviewed - No data to display  EKG   Radiology No results found.  Procedures Procedures (including critical care time)  Medications Ordered in UC Medications - No data to display  Initial Impression / Assessment and Plan / UC Course  I have reviewed the triage vital signs and the nursing notes.  Pertinent labs & imaging results that were available during my care of the patient were reviewed by me and considered in my medical decision making (see chart for details).     1.  Acute bacterial sinusitis greater than 10 days duration: Augmentin 875-125 mg twice daily for 7 days Tessalon Perles as needed for cough Fluticasone nasal spray Saline nasal spray Humidifier use Mucinex as  needed Return to urgent care if symptoms worsen.  No indication for flu or COVID testing since patient's symptoms have been ongoing for the past couple of weeks. Final Clinical Impressions(s) / UC Diagnoses   Final diagnoses:  Acute bacterial sinusitis     Discharge Instructions  Please take medications as prescribed Return to urgent care if symptoms worsen Maintain adequate hydration.   ED Prescriptions     Medication Sig Dispense Auth. Provider   amoxicillin-clavulanate (AUGMENTIN) 875-125 MG tablet Take 1 tablet by mouth every 12 (twelve) hours. 14 tablet Hydee Fleece, Britta Mccreedy, MD   benzonatate (TESSALON) 100 MG capsule Take 1 capsule (100 mg total) by mouth 3 (three) times daily as needed for cough. 21 capsule Veora Fonte, Britta Mccreedy, MD   fluticasone (FLONASE) 50 MCG/ACT nasal spray Place 1 spray into both nostrils daily. 16 g Merrilee Jansky, MD   guaiFENesin (MUCINEX) 600 MG 12 hr tablet Take 1 tablet (600 mg total) by mouth 2 (two) times daily. -- Merrilee Jansky, MD      PDMP not reviewed this encounter.   Merrilee Jansky, MD 12/28/20 [phone]

## 2020-12-28 NOTE — ED Triage Notes (Signed)
Pt here with C/O Head congestion for 2 weeks, and cough. Has been trying allegra with no relief.

## 2020-12-28 NOTE — Discharge Instructions (Addendum)
Please take medications as prescribed Return to urgent care if symptoms worsen Maintain adequate hydration.

## 2021-02-08 ENCOUNTER — Ambulatory Visit
Admission: RE | Admit: 2021-02-08 | Discharge: 2021-02-08 | Disposition: A | Discharge status: Home / Self Care | Payer: BC Managed Care – PPO | Source: Ambulatory Visit | Attending: Emergency Medicine | Admitting: Emergency Medicine

## 2021-02-08 ENCOUNTER — Other Ambulatory Visit: Payer: Self-pay

## 2021-02-08 VITALS — BP 128/85 | HR 118 | Temp 98.5°F | Resp 22

## 2021-02-08 DIAGNOSIS — R030 Elevated blood-pressure reading, without diagnosis of hypertension: Secondary | ICD-10-CM

## 2021-02-08 DIAGNOSIS — B349 Viral infection, unspecified: Secondary | ICD-10-CM | POA: Diagnosis not present

## 2021-02-08 NOTE — ED Provider Notes (Signed)
Jacqueline Salinas    CSN: [id number] Arrival date & time: 02/08/21  1231      History   Chief Complaint Chief Complaint  Patient presents with   Cough   Nasal Congestion   Headache    HPI Jacqueline Salinas is a 50 y.o. female.  Patient presents with 2-day history of headache, body aches, fatigue, congestion, sore throat, cough.  Treatment at home with Tylenol.  No fever, rash, shortness of breath, vomiting, diarrhea or other symptoms.  Her medical history includes migraine headaches, anxiety, depression, Raynaud's disease.  Patient is an [employer] information [employer] manager.  The history is provided by the patient and medical records.   Past Medical History:  Diagnosis Date   Anxiety    COVID-19 10/2019   Depression    Migraine with acute onset aura    Migraines     Patient Active Problem List   Diagnosis Date Noted   Raynaud's disease without gangrene 06/03/2020   Migraine 12/30/2014    History reviewed. No pertinent surgical history.  OB History   No obstetric history on file.      Home Medications    Prior to Admission medications   Medication Sig Start Date End Date Taking? Authorizing Provider  amLODipine (NORVASC) 2.5 MG tablet TAKE 1 TABLET(2.5 MG) BY MOUTH DAILY 09/04/20   Laural Benes, Megan P, DO  amoxicillin-clavulanate (AUGMENTIN) 875-125 MG tablet Take 1 tablet by mouth every 12 (twelve) hours. 12/28/20   Merrilee Jansky, MD  amphetamine-dextroamphetamine (ADDERALL XR) 30 MG 24 hr capsule TK ONE C PO QAM 09/30/17   [provider]  amphetamine-dextroamphetamine (ADDERALL) 10 MG tablet TK 1 T PO QPM 09/30/17   [provider]  benzonatate (TESSALON) 100 MG capsule Take 1 capsule (100 mg total) by mouth 3 (three) times daily as needed for cough. 12/28/20   Merrilee Jansky, MD  buPROPion (WELLBUTRIN SR) 100 MG 12 hr tablet Take 100 mg by mouth 2 (two) times daily. 06/24/20   [provider]  Cholecalciferol (VITAMIN D3) 1.25 MG (50000 UT)  CAPS Take 1 capsule by mouth once a week. 03/25/20   [provider]  clonazePAM (KLONOPIN) 0.5 MG tablet TK SS T PO BID PRN 10/22/14   [provider]  drospirenone-ethinyl estradiol (YAZ,GIANVI,LORYNA) 3-0.02 MG tablet Take 1 tablet by mouth daily.    [provider]  fluticasone (FLONASE) 50 MCG/ACT nasal spray Place 1 spray into both nostrils daily. 12/28/20   Merrilee Jansky, MD  guaiFENesin (MUCINEX) 600 MG 12 hr tablet Take 1 tablet (600 mg total) by mouth 2 (two) times daily. 12/28/20   Lamptey, Britta Mccreedy, MD    Family History Family History  Problem Relation Age of Onset   Migraines Mother    Dementia Maternal Grandmother    Lung disease Maternal Grandfather    Alzheimer's disease Paternal Grandfather     Social History Social History   Tobacco Use   Smoking status: Never   Smokeless tobacco: Never  Vaping Use   Vaping Use: Never used  Substance Use Topics   Alcohol use: Yes    Alcohol/week: 0.0 standard drinks    Comment: Rare   Drug use: No     Allergies   Patient has no known allergies.   Review of Systems Review of Systems  Constitutional:  Positive for fatigue. Negative for chills and fever.  HENT:  Positive for congestion and sore throat. Negative for ear pain.   Respiratory:  Positive for cough.  Negative for shortness of breath.   Cardiovascular:  Negative for chest pain and palpitations.  Gastrointestinal:  Negative for diarrhea and vomiting.  Skin:  Negative for color change and rash.  Neurological:  Positive for headaches. Negative for dizziness and syncope.  All other systems reviewed and are negative.   Physical Exam Triage Vital Signs ED Triage Vitals  Enc Vitals Group     BP 02/08/21 1243 (!) 169/91     Pulse Rate 02/08/21 1243 (!) 132     Resp 02/08/21 1243 (!) 22     Temp 02/08/21 1243 98.5 F (36.9 C)     Temp src --      SpO2 02/08/21 1243 98 %     Weight --      Height --      Head Circumference --       Peak Flow --      Pain Score 02/08/21 1242 5     Pain Loc --      Pain Edu? --      Excl. in GC? --    No data found.  Updated Vital Signs BP 128/85   Pulse (!) 118   Temp 98.5 F (36.9 C)   Resp (!) 22   SpO2 100%   Visual Acuity Right Eye Distance:   Left Eye Distance:   Bilateral Distance:    Right Eye Near:   Left Eye Near:    Bilateral Near:     Physical Exam Vitals and nursing note reviewed.  Constitutional:      General: She is not in acute distress.    Appearance: She is well-developed.  HENT:     Right Ear: Tympanic membrane normal.     Left Ear: Tympanic membrane normal.     Nose: Nose normal.     Mouth/Throat:     Mouth: Mucous membranes are moist.     Pharynx: Oropharynx is clear.  Cardiovascular:     Rate and Rhythm: Normal rate and regular rhythm.     Heart sounds: Normal heart sounds.  Pulmonary:     Effort: Pulmonary effort is normal. No respiratory distress.     Breath sounds: Normal breath sounds.  Abdominal:     Palpations: Abdomen is soft.     Tenderness: There is no abdominal tenderness.  Musculoskeletal:     Cervical back: Neck supple.  Skin:    General: Skin is warm and dry.  Neurological:     Mental Status: She is alert.  Psychiatric:        Mood and Affect: Mood normal.        Behavior: Behavior normal.     UC Treatments / Results  Labs (all labs ordered are listed, but only abnormal results are displayed) Labs Reviewed  COVID-19, FLU A+B AND RSV    EKG   Radiology No results found.  Procedures Procedures (including critical care time)  Medications Ordered in UC Medications - No data to display  Initial Impression / Assessment and Plan / UC Course  I have reviewed the triage vital signs and the nursing notes.  Pertinent labs & imaging results that were available during my care of the patient were reviewed by me and considered in my medical decision making (see chart for details).   Viral illness, Elevated  blood pressure reading.  COVID, RSV, Flu pending.  Instructed patient to self quarantine per CDC guidelines.  Discussed symptomatic treatment including Tylenol, rest, hydration.  Instructed patient to follow up with  PCP if symptoms are not improving.  Also discussed that her blood pressure is elevated today and needs to be rechecked by her PCP in 2 to 4 weeks.  Patient agrees to plan of care.    Final Clinical Impressions(s) / UC Diagnoses   Final diagnoses:  Viral illness  Elevated blood pressure reading     Discharge Instructions      Your COVID, RSV, and Flu tests are pending.  You should self quarantine until the test results are back.  Take Tylenol as needed for fever or discomfort.  Rest and keep yourself hydrated.  Follow-up with your primary care provider if your symptoms are not improving.    Your blood pressure is elevated today at 169/91; repeat 128/85.  Please have this rechecked by your primary care provider in 2-4 weeks.           ED Prescriptions   None    PDMP not reviewed this encounter.   Sharion Balloon, NP 02/08/21 1320

## 2021-02-08 NOTE — ED Triage Notes (Signed)
Pt here with cough, headache and congestion x 2 days.

## 2021-02-08 NOTE — Discharge Instructions (Addendum)
Your COVID, RSV, and Flu tests are pending.  You should self quarantine until the test results are back.  Take Tylenol as needed for fever or discomfort.  Rest and keep yourself hydrated.  Follow-up with your primary care provider if your symptoms are not improving.    Your blood pressure is elevated today at 169/91; repeat 128/85.  Please have this rechecked by your primary care provider in 2-4 weeks.

## 2021-02-09 LAB — COVID-19, FLU A+B AND RSV
Influenza A, NAA: NOT DETECTED
Influenza B, NAA: NOT DETECTED
RSV, NAA: NOT DETECTED
SARS-CoV-2, NAA: NOT DETECTED

## 2021-02-26 ENCOUNTER — Ambulatory Visit: Payer: BC Managed Care – PPO

## 2021-05-05 ENCOUNTER — Ambulatory Visit: Payer: Self-pay | Admitting: *Deleted

## 2021-05-05 NOTE — Telephone Encounter (Signed)
I would advise her to be seen sooner

## 2021-05-05 NOTE — Telephone Encounter (Signed)
Per agent:  "Patient experiencing headaches since she was young but has worsened over the last 2 weeks. Patient seeking clinical advice prior to her 05/15/2021 appointment."   Chief Complaint: Headaches Symptoms: frontal headache, around eyes, sinus drainage, mild cough, mild sore throat, stress.   H/O migraines as teen. Frequency: 2 weeks, occur daily but vary in intensity, 8/10 at worst. Worsens throughout day. OTC headache meds ineffective.  Pertinent Negatives: Patient denies  Disposition: [] ED /[] Urgent Care (no appt availability in office) / [x] Appointment(In office/virtual)/ []  [facility]/ [] Home Care/ [] Refused Recommended Disposition /[] [facility]/ []  Follow-up with PCP Additional Notes: Agent secured appt prior to triage for 05/15/21. 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 Assured pt NT would route to practice for PCPs review and final disposition.  Triage incomplete. Please advise: [phone]. Thank you     Reason for Disposition  [1] MODERATE headache (e.g., interferes with normal activities) AND [2] present > 24 hours AND [3] unexplained  (Exceptions: analgesics not tried, typical migraine, or headache part of viral illness)  Answer Assessment - Initial Assessment Questions 1. LOCATION: "Where does it hurt?"      Front, around eyes 2. ONSET: "When did the headache start?" (Minutes, hours or days)      2 weeks ago 3. PATTERN: "Does the pain come and go, or has it been constant since it started?"     Worse as day progresses 4. SEVERITY: "How bad is the pain?" and "What does it keep you from doing?"  (e.g., Scale 1-10; mild, moderate, or severe)   - MILD (1-3): doesn't interfere with normal activities    - MODERATE (4-7): interferes with normal activities or awakens from sleep    - SEVERE (8-10): excruciating pain, unable to do any normal activities        At their worse, 8-9/10 5. RECURRENT  SYMPTOM: "Have you ever had headaches before?" If Yes, ask: "When was the last time?" and "What happened that time?"       6. CAUSE: "What do you think is causing the headache?"     Maybe stress. 7. MIGRAINE: "Have you been diagnosed with migraine headaches?" If Yes, ask: "Is this headache similar?"      Yes, years ago, when a teen 8. HEAD INJURY: "Has there been any recent injury to the head?"      no 9. OTHER SYMPTOMS: "Do you have any other symptoms?" (fever, stiff neck, eye pain, sore throat, cold symptoms)     Mild congestion, mild cough, sinus drainage, stress  Protocols used: Headache-A-AH

## 2021-05-05 NOTE — Telephone Encounter (Signed)
Pt being seen 3/2

## 2021-05-07 ENCOUNTER — Other Ambulatory Visit: Payer: Self-pay

## 2021-05-07 ENCOUNTER — Ambulatory Visit: Payer: BC Managed Care – PPO | Admitting: Family Medicine

## 2021-05-07 ENCOUNTER — Encounter: Payer: Self-pay | Admitting: Family Medicine

## 2021-05-07 VITALS — BP 129/84 | HR 96 | Temp 98.4°F | Ht 66.0 in | Wt 134.2 lb

## 2021-05-07 DIAGNOSIS — R519 Headache, unspecified: Secondary | ICD-10-CM

## 2021-05-07 MED ORDER — TRIAMCINOLONE ACETONIDE 40 MG/ML IJ SUSP
40.0000 mg | Freq: Once | INTRAMUSCULAR | Status: AC
  Administered 2021-05-07: 40 mg via INTRAMUSCULAR

## 2021-05-07 NOTE — Progress Notes (Signed)
? ?BP 129/84   Pulse 96   Temp 98.4 ?F (36.9 ?C) (Oral)   Ht 5\' 6"  (1.676 m)   Wt 134 lb 3.2 oz (60.9 kg)   SpO2 100%   BMI 21.66 kg/m?   ? ?Subjective:  ? ? Patient ID: Jacqueline Salinas, female    DOB: 02-Feb-1971, 51 y.o.   MRN: [id number] ? ?HPI: ?Jacqueline Salinas is a 51 y.o. female ? ?Chief Complaint  ?Patient presents with  ? Migraine  ?  Patient is here to be seen for Migraines. Patient states she has been experiencing them since she was young and they have gradually gotten worse. Patient states it is not her typical migraine. Patient states this is more like a headache that is located in the frontal lobe near her eye. Patient states her last eye exam was Summer July of 2022.  ? ?MIGRAINES ?Duration: chronic, but have been better as she's gotten older, the forehead headaches have been going on for a few days ?Onset: sudden ?Severity: severe on Monday ?Quality: aching pressure ?Frequency: constant ?Location: into her forehead and behind her eyes ?Headache duration: about a day ?Radiation: no ?Time of day headache occurs: constant ?Headache status at time of visit: asymptomatic ?Treatments attempted: Treatments attempted: zyrtec   ?Aura: no ?Nausea:  no ?Vomiting: no ?Photophobia:  no ?Phonophobia:  yes ?Effect on social functioning:  yes ?Confusion:  no ?Gait disturbance/ataxia:  no ?Behavioral changes:  no ?Fevers:  no ? ?Relevant past medical, surgical, family and social history reviewed and updated as indicated. Interim medical history since our last visit reviewed. ?Allergies and medications reviewed and updated. ? ?Review of Systems  ?Constitutional: Negative.   ?HENT:  Positive for congestion. Negative for dental problem, drooling, ear discharge, ear pain, facial swelling, hearing loss, mouth sores, nosebleeds, postnasal drip, rhinorrhea, sinus pressure, sinus pain, sneezing, sore throat, tinnitus, trouble swallowing and voice change.   ?Respiratory: Negative.    ?Cardiovascular: Negative.    ?Musculoskeletal: Negative.   ?Neurological:  Positive for headaches. Negative for dizziness, tremors, seizures, syncope, facial asymmetry, speech difficulty, weakness, light-headedness and numbness.  ?Psychiatric/Behavioral: Negative.    ? ?Per HPI unless specifically indicated above ? ?   ?Objective:  ?  ?BP 129/84   Pulse 96   Temp 98.4 ?F (36.9 ?C) (Oral)   Ht 5\' 6"  (1.676 m)   Wt 134 lb 3.2 oz (60.9 kg)   SpO2 100%   BMI 21.66 kg/m?   ?Wt Readings from Last 3 Encounters:  ?05/07/21 134 lb 3.2 oz (60.9 kg)  ?12/28/20 133 lb (60.3 kg)  ?09/04/20 129 lb 3.2 oz (58.6 kg)  ?  ?Physical Exam ?Vitals and nursing note reviewed.  ?Constitutional:   ?   General: She is not in acute distress. ?   Appearance: Normal appearance. She is not ill-appearing, toxic-appearing or diaphoretic.  ?HENT:  ?   Head: Normocephalic and atraumatic.  ?   Right Ear: External ear normal.  ?   Left Ear: External ear normal.  ?   Nose: Nose normal.  ?   Mouth/Throat:  ?   Mouth: Mucous membranes are moist.  ?   Pharynx: Oropharynx is clear.  ?Eyes:  ?   General: No scleral icterus.    ?   Right eye: No discharge.     ?   Left eye: No discharge.  ?   Extraocular Movements: Extraocular movements intact.  ?   Conjunctiva/sclera: Conjunctivae normal.  ?   Pupils: Pupils are equal,  round, and reactive to light.  ?Cardiovascular:  ?   Rate and Rhythm: Normal rate and regular rhythm.  ?   Pulses: Normal pulses.  ?   Heart sounds: Normal heart sounds. No murmur heard. ?  No friction rub. No gallop.  ?Pulmonary:  ?   Effort: Pulmonary effort is normal. No respiratory distress.  ?   Breath sounds: Normal breath sounds. No stridor. No wheezing, rhonchi or rales.  ?Chest:  ?   Chest wall: No tenderness.  ?Musculoskeletal:     ?   General: Normal range of motion.  ?   Cervical back: Normal range of motion and neck supple.  ?Skin: ?   General: Skin is warm and dry.  ?   Capillary Refill: Capillary refill takes less than 2 seconds.  ?   Coloration:  Skin is not jaundiced or pale.  ?   Findings: No bruising, erythema, lesion or rash.  ?Neurological:  ?   General: No focal deficit present.  ?   Mental Status: She is alert and oriented to person, place, and time. Mental status is at baseline.  ?Psychiatric:     ?   Mood and Affect: Mood normal.     ?   Behavior: Behavior normal.     ?   Thought Content: Thought content normal.     ?   Judgment: Judgment normal.  ? ? ?Results for orders placed or performed during the hospital encounter of 02/08/21  ?COVID-19, Flu A+B and RSV ([facility])  ?Result Value Ref Range  ? SARS-CoV-2, NAA Not Detected Not Detected  ? Influenza A, NAA Not Detected Not Detected  ? Influenza B, NAA Not Detected Not Detected  ? RSV, NAA Not Detected Not Detected  ? Test Information: Comment   ? ?   ?Assessment & Plan:  ? ?Problem List Items Addressed This Visit   ?None ?Visit Diagnoses   ? ? Sinus headache    -  Primary  ? Will treat with kenalog to help with congestion. Continue zyrtec. Call if not getting better or getting worse.   ? Relevant Medications  ? triamcinolone acetonide (KENALOG-40) injection 40 mg (Start on 05/07/2021  2:00 PM)  ? ?  ?  ? ?Follow up plan: ?No follow-ups on file. ? ? ? ? ? ?

## 2021-05-15 ENCOUNTER — Ambulatory Visit: Payer: BC Managed Care – PPO | Admitting: Family Medicine

## 2021-05-22 ENCOUNTER — Ambulatory Visit
Admission: RE | Admit: 2021-05-22 | Discharge: 2021-05-22 | Disposition: A | Discharge status: Home / Self Care | Payer: BC Managed Care – PPO | Source: Ambulatory Visit | Attending: Physician Assistant | Admitting: Physician Assistant

## 2021-05-22 ENCOUNTER — Other Ambulatory Visit: Payer: Self-pay

## 2021-05-22 VITALS — BP 128/86 | HR 68 | Temp 98.3°F | Resp 20

## 2021-05-22 DIAGNOSIS — J028 Acute pharyngitis due to other specified organisms: Secondary | ICD-10-CM | POA: Diagnosis not present

## 2021-05-22 LAB — POCT RAPID STREP A (OFFICE): Rapid Strep A Screen: NEGATIVE

## 2021-05-22 NOTE — ED Provider Notes (Signed)
?[facility] ? ? ? ?CSN: [id number] ?Arrival date & time: 05/22/21  1505 ? ? ?  ? ?History   ?Chief Complaint ?Chief Complaint  ?Patient presents with  ? Sore Throat  ? Cough  ? ? ?HPI ?Jacqueline Salinas is a 51 y.o. female.  ? ?Pt complains of a sore throat.  Pt is a Pharmacist, hospital.  ? ?The history is provided by the patient. No language interpreter was used.  ?Sore Throat ?This is a new problem. The current episode started more than 2 days ago. The problem occurs constantly. Nothing aggravates the symptoms. Nothing relieves the symptoms.  ?Cough ? ?Past Medical History:  ?Diagnosis Date  ? Anxiety   ? COVID-19 10/2019  ? Depression   ? Migraine with acute onset aura   ? Migraines   ? ? ?Patient Active Problem List  ? Diagnosis Date Noted  ? Raynaud's disease without gangrene 06/03/2020  ? Migraine 12/30/2014  ? ? ?History reviewed. No pertinent surgical history. ? ?OB History   ?No obstetric history on file. ?  ? ? ? ?Home Medications   ? ?Prior to Admission medications   ?Medication Sig Start Date End Date Taking? Authorizing Provider  ?amLODipine (NORVASC) 2.5 MG tablet TAKE 1 TABLET(2.5 MG) BY MOUTH DAILY 09/04/20   Park Liter P, DO  ?amphetamine-dextroamphetamine (ADDERALL XR) 30 MG 24 hr capsule TK ONE C PO QAM 09/30/17   [provider]  ?amphetamine-dextroamphetamine (ADDERALL) 20 MG tablet Take 20 mg by mouth daily. 03/21/21   [provider]  ?buPROPion (WELLBUTRIN SR) 100 MG 12 hr tablet Take 100 mg by mouth 2 (two) times daily. 06/24/20   [provider]  ?Cholecalciferol (VITAMIN D3) 1.25 MG (50000 UT) CAPS Take 1 capsule by mouth once a week. 03/25/20   [provider]  ?clonazePAM (KLONOPIN) 0.5 MG tablet TK SS T PO BID PRN 10/22/14   [provider]  ?drospirenone-ethinyl estradiol (YAZ,GIANVI,LORYNA) 3-0.02 MG tablet Take 1 tablet by mouth daily.    [provider]  ?guaiFENesin (MUCINEX) 600 MG 12 hr tablet Take 1 tablet (600 mg total) by mouth 2  (two) times daily. 12/28/20   Chase Picket, MD  ? ? ?Family History ?Family History  ?Problem Relation Age of Onset  ? Migraines Mother   ? Dementia Maternal Grandmother   ? Lung disease Maternal Grandfather   ? Alzheimer's disease Paternal Grandfather   ? ? ?Social History ?Social History  ? ?Tobacco Use  ? Smoking status: Never  ? Smokeless tobacco: Never  ?Vaping Use  ? Vaping Use: Never used  ?Substance Use Topics  ? Alcohol use: Yes  ?  Alcohol/week: 0.0 standard drinks  ?  Comment: Rare  ? Drug use: No  ? ? ? ?Allergies   ?Patient has no known allergies. ? ? ?Review of Systems ?Review of Systems  ?Respiratory:  Positive for cough.   ?All other systems reviewed and are negative. ? ? ?Physical Exam ?Triage Vital Signs ?ED Triage Vitals  ?Enc Vitals Group  ?   BP 05/22/21 1528 128/86  ?   Pulse Rate 05/22/21 1528 68  ?   Resp 05/22/21 1528 20  ?   Temp 05/22/21 1528 98.3 ?F (36.8 ?C)  ?   Temp src --   ?   SpO2 05/22/21 1528 98 %  ?   Weight --   ?   Height --   ?   Head Circumference --   ?   Peak Flow --   ?  Pain Score 05/22/21 1529 5  ?   Pain Loc --   ?   Pain Edu? --   ?   Excl. in [place]? --   ? ?No data found. ? ?Updated Vital Signs ?BP 128/86   Pulse 68   Temp 98.3 ?F (36.8 ?C)   Resp 20   SpO2 98%  ? ?Visual Acuity ?Right Eye Distance:   ?Left Eye Distance:   ?Bilateral Distance:   ? ?Right Eye Near:   ?Left Eye Near:    ?Bilateral Near:    ? ?Physical Exam ?Vitals reviewed.  ?Constitutional:   ?   Appearance: She is well-developed.  ?HENT:  ?   Head: Normocephalic.  ?   Right Ear: Tympanic membrane normal.  ?   Left Ear: Tympanic membrane normal.  ?   Mouth/Throat:  ?   Mouth: Mucous membranes are moist.  ?   Pharynx: Uvula midline.  ?   Tonsils: Tonsillar exudate and tonsillar abscess present.  ?Cardiovascular:  ?   Rate and Rhythm: Normal rate.  ?Pulmonary:  ?   Effort: Pulmonary effort is normal.  ?Abdominal:  ?   Palpations: Abdomen is soft.  ?Skin: ?   General: Skin is warm.  ?Neurological:   ?   General: No focal deficit present.  ?   Mental Status: She is alert.  ? ? ? ?UC Treatments / Results  ?Labs ?(all labs ordered are listed, but only abnormal results are displayed) ?Labs Reviewed  ?POCT RAPID STREP A (OFFICE) - Normal  ? ? ?EKG ? ? ?Radiology ?No results found. ? ?Procedures ?Procedures (including critical care time) ? ?Medications Ordered in UC ?Medications - No data to display ? ?Initial Impression / Assessment and Plan / UC Course  ?I have reviewed the triage vital signs and the nursing notes. ? ?Pertinent labs & imaging results that were available during my care of the patient were reviewed by me and considered in my medical decision making (see chart for details). ? ?  ? ?MDM: strep negative  ?Final Clinical Impressions(s) / UC Diagnoses  ? ?Final diagnoses:  ?Acute pharyngitis due to other specified organisms  ? ? ? ?Discharge Instructions   ? ?  ?Return if any problems.   ? ? ?ED Prescriptions   ?None ?  ? ?PDMP not reviewed this encounter. ?An After Visit Summary was printed and given to the patient.  ?  ?Fransico Meadow, PA-C ?05/22/21 1640 ? ?

## 2021-05-22 NOTE — Discharge Instructions (Signed)
Return if any problems.

## 2021-05-22 NOTE — ED Triage Notes (Signed)
Pt here with sore throat and cough x 2 days,  ?

## 2021-08-08 ENCOUNTER — Ambulatory Visit
Admission: RE | Admit: 2021-08-08 | Discharge: 2021-08-08 | Disposition: A | Discharge status: Home / Self Care | Payer: BC Managed Care – PPO | Source: Ambulatory Visit | Attending: Student | Admitting: Student

## 2021-08-08 ENCOUNTER — Other Ambulatory Visit: Payer: Self-pay

## 2021-08-08 VITALS — BP 126/75 | HR 114 | Temp 98.0°F | Resp 14 | Ht 66.0 in | Wt 135.0 lb

## 2021-08-08 DIAGNOSIS — J0100 Acute maxillary sinusitis, unspecified: Secondary | ICD-10-CM

## 2021-08-08 MED ORDER — AMOXICILLIN-POT CLAVULANATE 875-125 MG PO TABS: 1.0000 | ORAL_TABLET | Freq: Two times a day (BID) | ORAL | 0 refills | Status: DC

## 2021-08-08 NOTE — Discharge Instructions (Addendum)
-  Start the antibiotic-Augmentin (amoxicillin-clavulanate), 1 pill every 12 hours for 7 days.  You can take this with food like with breakfast and dinner. ?-Continue over-the-counter medications if they're helping  ?

## 2021-08-08 NOTE — ED Triage Notes (Signed)
Patient c/o cough and congestion for a week.  Patient reports sinus pain and pressure that has gotten worse since Thursday.  Patient denies fevers.

## 2021-08-08 NOTE — ED Provider Notes (Signed)
[facility]    CSN: [id number] Arrival date & time: 08/08/21  0957      History   Chief Complaint Chief Complaint  Patient presents with   Nasal Congestion   Sinus Problem    HPI Jacqueline Salinas is a 51 y.o. female presenting with cough and congestion for 1 week, with progressively worsening sinus pressure and pain and purulent nasal drainage.  History noncontributory.  She has been managing the symptoms with Sudafed with temporary relief.  Cough is minimally productive, there is no associated dyspnea, chest pain, new back pain, new flank pain, known fevers.  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  HPI  Past Medical History:  Diagnosis Date   Anxiety    COVID-19 10/2019   Depression    Migraine with acute onset aura    Migraines     Patient Active Problem List   Diagnosis Date Noted   Raynaud's disease without gangrene 06/03/2020   Migraine 12/30/2014    History reviewed. No pertinent surgical history.  OB History   No obstetric history on file.      Home Medications    Prior to Admission medications   Medication Sig Start Date End Date Taking? Authorizing Provider  amLODipine (NORVASC) 2.5 MG tablet TAKE 1 TABLET(2.5 MG) BY MOUTH DAILY 09/04/20  Yes Johnson, Megan P, DO  amoxicillin-clavulanate (AUGMENTIN) 875-125 MG tablet Take 1 tablet by mouth every 12 (twelve) hours. 08/08/21  Yes Rhys Martini, PA-C  amphetamine-dextroamphetamine (ADDERALL XR) 30 MG 24 hr capsule TK ONE C PO QAM 09/30/17  Yes [provider]  amphetamine-dextroamphetamine (ADDERALL) 20 MG tablet Take 20 mg by mouth daily. 03/21/21  Yes [provider]  buPROPion (WELLBUTRIN SR) 100 MG 12 hr tablet Take 100 mg by mouth 2 (two) times daily. 06/24/20  Yes [provider]  Cholecalciferol (VITAMIN D3) 1.25 MG (50000 UT) CAPS Take 1 capsule by mouth once a week. 03/25/20  Yes [provider]  drospirenone-ethinyl estradiol  (YAZ,GIANVI,LORYNA) 3-0.02 MG tablet Take 1 tablet by mouth daily.   Yes [provider]  clonazePAM (KLONOPIN) 0.5 MG tablet TK SS T PO BID PRN 10/22/14   [provider]  guaiFENesin (MUCINEX) 600 MG 12 hr tablet Take 1 tablet (600 mg total) by mouth 2 (two) times daily. 12/28/20   Lamptey, Britta Mccreedy, MD    Family History Family History  Problem Relation Age of Onset   Migraines Mother    Dementia Maternal Grandmother    Lung disease Maternal Grandfather    Alzheimer's disease Paternal Grandfather     Social History Social History   Tobacco Use   Smoking status: Never   Smokeless tobacco: Never  Vaping Use   Vaping Use: Never used  Substance Use Topics   Alcohol use: Yes    Alcohol/week: 0.0 standard drinks    Comment: Rare   Drug use: No     Allergies   Patient has no known allergies.   Review of Systems Review of Systems  Constitutional:  Negative for appetite change, chills and fever.  HENT:  Positive for congestion and sinus pressure. Negative for ear pain, rhinorrhea, sinus pain and sore throat.   Eyes:  Negative for redness and visual disturbance.  Respiratory:  Positive for cough. Negative for chest tightness, shortness of breath and wheezing.   Cardiovascular:  Negative for chest pain and palpitations.  Gastrointestinal:  Negative for abdominal pain, constipation, diarrhea, nausea and vomiting.  Genitourinary:  Negative for dysuria, frequency and urgency.  Musculoskeletal:  Negative for myalgias.  Neurological:  Negative for dizziness, weakness and headaches.  Psychiatric/Behavioral:  Negative for confusion.   All other systems reviewed and are negative.   Physical Exam Triage Vital Signs ED Triage Vitals  Enc Vitals Group     BP 08/08/21 1009 126/75     Pulse Rate 08/08/21 1009 (!) 114     Resp 08/08/21 1009 14     Temp 08/08/21 1009 98 F (36.7 C)     Temp Source 08/08/21 1009 Oral     SpO2 08/08/21 1009 99 %     Weight 08/08/21  1007 135 lb (61.2 kg)     Height 08/08/21 1007 5\' 6"  (1.676 m)     Head Circumference --      Peak Flow --      Pain Score 08/08/21 1007 7     Pain Loc --      Pain Edu? --      Excl. in [place]? --    No data found.  Updated Vital Signs BP 126/75 (BP Location: Left Arm)   Pulse (!) 114   Temp 98 F (36.7 C) (Oral)   Resp 14   Ht 5\' 6"  (1.676 m)   Wt 135 lb (61.2 kg)   SpO2 99%   BMI 21.79 kg/m   Visual Acuity Right Eye Distance:   Left Eye Distance:   Bilateral Distance:    Right Eye Near:   Left Eye Near:    Bilateral Near:     Physical Exam Vitals reviewed.  Constitutional:      General: She is not in acute distress.    Appearance: Normal appearance. She is not ill-appearing.  HENT:     Head: Normocephalic and atraumatic.     Right Ear: Tympanic membrane, ear canal and external ear normal. No tenderness. No middle ear effusion. There is no impacted cerumen. Tympanic membrane is not perforated, erythematous, retracted or bulging.     Left Ear: Tympanic membrane, ear canal and external ear normal. No tenderness.  No middle ear effusion. There is no impacted cerumen. Tympanic membrane is not perforated, erythematous, retracted or bulging.     Nose: No congestion.     Right Sinus: Maxillary sinus tenderness present. No frontal sinus tenderness.     Left Sinus: Maxillary sinus tenderness present. No frontal sinus tenderness.     Mouth/Throat:     Mouth: Mucous membranes are moist.     Pharynx: Uvula midline. No oropharyngeal exudate or posterior oropharyngeal erythema.  Eyes:     Extraocular Movements: Extraocular movements intact.     Pupils: Pupils are equal, round, and reactive to light.  Cardiovascular:     Rate and Rhythm: Regular rhythm. Tachycardia present.     Pulses:          Radial pulses are 2+ on the right side and 2+ on the left side.     Heart sounds: Normal heart sounds.     Comments: Radial pulses equal and regular   Pulmonary:     Effort: Pulmonary  effort is normal.     Breath sounds: Normal breath sounds. No decreased breath sounds, wheezing, rhonchi or rales.  Abdominal:     Palpations: Abdomen is soft.     Tenderness: There is no abdominal tenderness. There is no guarding or rebound.  Lymphadenopathy:     Cervical: No cervical adenopathy.     Right cervical: No superficial cervical  adenopathy.    Left cervical: No superficial cervical adenopathy.  Neurological:     General: No focal deficit present.     Mental Status: She is alert and oriented to person, place, and time.  Psychiatric:        Mood and Affect: Mood normal.        Behavior: Behavior normal.        Thought Content: Thought content normal.        Judgment: Judgment normal.     UC Treatments / Results  Labs (all labs ordered are listed, but only abnormal results are displayed) Labs Reviewed - No data to display  EKG   Radiology No results found.  Procedures Procedures (including critical care time)  Medications Ordered in UC Medications - No data to display  Initial Impression / Assessment and Plan / UC Course  I have reviewed the triage vital signs and the nursing notes.  Pertinent labs & imaging results that were available during my care of the patient were reviewed by me and considered in my medical decision making (see chart for details).     This patient is a very pleasant 51 y.o. year old female presenting with sinusitis following viral syndrome. She is tachycardic at 114, but afebrile and nontachypneic, oxygenating well on room air.  She attributes the tachycardia to facial pain; there is no chest pain, shortness of breath, unilateral leg swelling.  She has also been taking the pseudoephedrine over-the-counter, which likely contributes to the tachycardia.  Progressively worsening sinus pressure and purulent nasal drainage for about 1 week.  We will manage with Augmentin as below.  ED return precautions discussed. Patient verbalizes  understanding and agreement.   Coding Level 4 for acute illness with systemic symptoms, and prescription drug management   Final Clinical Impressions(s) / UC Diagnoses   Final diagnoses:  Acute non-recurrent maxillary sinusitis     Discharge Instructions      -Start the antibiotic-Augmentin (amoxicillin-clavulanate), 1 pill every 12 hours for 7 days.  You can take this with food like with breakfast and dinner. -Continue over-the-counter medications if they're helping    ED Prescriptions     Medication Sig Dispense Auth. Provider   amoxicillin-clavulanate (AUGMENTIN) 875-125 MG tablet Take 1 tablet by mouth every 12 (twelve) hours. 14 tablet Rhys Martini, PA-C      PDMP not reviewed this encounter.   Rhys Martini, PA-C 08/08/21 1059

## 2021-08-11 ENCOUNTER — Encounter: Payer: Self-pay | Admitting: Nurse Practitioner

## 2021-08-11 ENCOUNTER — Ambulatory Visit: Payer: BC Managed Care – PPO | Admitting: Nurse Practitioner

## 2021-08-11 VITALS — BP 118/67 | HR 89 | Temp 98.3°F | Wt 137.8 lb

## 2021-08-11 DIAGNOSIS — J0110 Acute frontal sinusitis, unspecified: Secondary | ICD-10-CM

## 2021-08-11 NOTE — Progress Notes (Signed)
BP 118/67   Pulse 89   Temp 98.3 F (36.8 C) (Oral)   Wt 137 lb 12.8 oz (62.5 kg)   SpO2 98%   BMI 22.24 kg/m    Subjective:    Patient ID: Jacqueline Salinas, female    DOB: 12/13/1970, 51 y.o.   MRN: [id number]  HPI: Jacqueline Salinas is a 51 y.o. female  Chief Complaint  Patient presents with   URI    Pt states she has been having cold symptoms for the past 2 weeks. States she was seen at [facility] on Saturday, treated with Augmentin. States she has been having green sinus drainage, cough, headache, and sinus pressure. States she has been tired since taking the medication. States she is feeling a little better today than she did yesterday when she made the appointment yesterday    UPPER RESPIRATORY TRACT INFECTION Worst symptom: ongoing x2 weeks. Went to UC on Saturday and was treated with Augumentin.  Yesterday when she made the appointment she was feeling a lot worse.  She is still having a lot of sinus pressure and headaches.  Relevant past medical, surgical, family and social history reviewed and updated as indicated. Interim medical history since our last visit reviewed. Allergies and medications reviewed and updated.  Review of Systems  HENT:  Positive for sinus pressure.   Neurological:  Positive for headaches.   Per HPI unless specifically indicated above     Objective:    BP 118/67   Pulse 89   Temp 98.3 F (36.8 C) (Oral)   Wt 137 lb 12.8 oz (62.5 kg)   SpO2 98%   BMI 22.24 kg/m   Wt Readings from Last 3 Encounters:  08/11/21 137 lb 12.8 oz (62.5 kg)  08/08/21 135 lb (61.2 kg)  05/07/21 134 lb 3.2 oz (60.9 kg)    Physical Exam Vitals and nursing note reviewed.  Constitutional:      General: She is not in acute distress.    Appearance: Normal appearance. She is normal weight. She is not ill-appearing, toxic-appearing or diaphoretic.  HENT:     Head: Normocephalic.     Right Ear: Tympanic membrane and external ear normal.     Left Ear: Tympanic membrane and  external ear normal.     Nose: Congestion present.     Right Sinus: Frontal sinus tenderness present. No maxillary sinus tenderness.     Left Sinus: Frontal sinus tenderness present. No maxillary sinus tenderness.     Mouth/Throat:     Mouth: Mucous membranes are moist.     Pharynx: Oropharynx is clear. No oropharyngeal exudate or posterior oropharyngeal erythema.  Eyes:     General:        Right eye: No discharge.        Left eye: No discharge.     Extraocular Movements: Extraocular movements intact.     Conjunctiva/sclera: Conjunctivae normal.     Pupils: Pupils are equal, round, and reactive to light.  Cardiovascular:     Rate and Rhythm: Normal rate and regular rhythm.     Heart sounds: No murmur heard. Pulmonary:     Effort: Pulmonary effort is normal. No respiratory distress.     Breath sounds: Normal breath sounds. No wheezing or rales.  Musculoskeletal:     Cervical back: Normal range of motion and neck supple.  Skin:    General: Skin is warm and dry.     Capillary Refill: Capillary refill takes less than 2 seconds.  Neurological:     General: No focal deficit present.     Mental Status: She is alert and oriented to person, place, and time. Mental status is at baseline.  Psychiatric:        Mood and Affect: Mood normal.        Behavior: Behavior normal.        Thought Content: Thought content normal.        Judgment: Judgment normal.    Results for orders placed or performed during the hospital encounter of 05/22/21  POCT rapid strep A  Result Value Ref Range   Rapid Strep A Screen Negative Negative      Assessment & Plan:   Problem List Items Addressed This Visit   None Visit Diagnoses     Acute non-recurrent frontal sinusitis    -  Primary   Complete Augumentin prescribed by UC. Can use mucinex and Ibuprofen PRN for symptom management. Follow up if symptoms do not improve.        Follow up plan: Return if symptoms worsen or fail to improve.

## 2021-09-09 LAB — COLOGUARD

## 2022-01-04 ENCOUNTER — Ambulatory Visit: Payer: Self-pay

## 2022-01-04 NOTE — Telephone Encounter (Signed)
Message from Scherrie Gerlach sent at 01/04/2022  8:39 AM EDT  Summary: cough/congestion   Pt has cough, runny nose, congestion since around last Wed.no appts today, but scheduled for tues at 4:20pm, but asked if a nurse could call.          Chief Complaint: cough Symptoms: post nasal drainage, stuffy nose, nasal congestion, headache Frequency: Friday Pertinent Negatives: Patient denies fever, SOB Disposition: [] ED /[] Urgent Care (no appt availability in office) / [x] Appointment(In office/virtual)/ []  [facility]/ [] Home Care/ [] Refused Recommended Disposition /[] [facility]/ []  Follow-up with PCP Additional Notes: Pt requested appt and pritor to triage, agent made appt for tomorrow.  Reason for Disposition  Cough with cold symptoms (e.g., runny nose, postnasal drip, throat clearing)  Answer Assessment - Initial Assessment Questions 1. ONSET: "When did the cough begin?"      Friday 2. SEVERITY: "How bad is the cough today?"      Not as bad  not coughing as much 3. SPUTUM: "Describe the color of your sputum" (none, dry cough; clear, white, yellow, green)     no 4. HEMOPTYSIS: "Are you coughing up any blood?" If so ask: "How much?" (flecks, streaks, tablespoons, etc.)     N/a 5. DIFFICULTY BREATHING: "Are you having difficulty breathing?" If Yes, ask: "How bad is it?" (e.g., mild, moderate, severe)    - MILD: No SOB at rest, mild SOB with walking, speaks normally in sentences, can lie down, no retractions, pulse < 100.    - MODERATE: SOB at rest, SOB with minimal exertion and prefers to sit, cannot lie down flat, speaks in phrases, mild retractions, audible wheezing, pulse 100-120.    - SEVERE: Very SOB at rest, speaks in single words, struggling to breathe, sitting hunched forward, retractions, pulse > 120      no 6. FEVER: "Do you have a fever?" If Yes, ask: "What is your temperature, how was it measured, and when did it start?"     no 7. CARDIAC HISTORY: "Do  you have any history of heart disease?" (e.g., heart attack, congestive heart failure)      N/a 8. LUNG HISTORY: "Do you have any history of lung disease?"  (e.g., pulmonary embolus, asthma, emphysema)     N/a 9. PE RISK FACTORS: "Do you have a history of blood clots?" (or: recent major surgery, recent prolonged travel, bedridden)     N/a 10. OTHER SYMPTOMS: "Do you have any other symptoms?" (e.g., runny nose, wheezing, chest pain)       Congestion, headache, stuffy nose 11. PREGNANCY: "Is there any chance you are pregnant?" "When was your last menstrual period?"       N/a 12. TRAVEL: "Have you traveled out of the country in the last month?" (e.g., travel history, exposures)       N/a  Protocols used: Cough - Acute Non-Productive-A-AH

## 2022-01-04 NOTE — Patient Instructions (Signed)

## 2022-01-05 ENCOUNTER — Encounter: Payer: Self-pay | Admitting: Nurse Practitioner

## 2022-01-05 ENCOUNTER — Ambulatory Visit: Payer: BC Managed Care – PPO | Admitting: Nurse Practitioner

## 2022-01-05 DIAGNOSIS — J321 Chronic frontal sinusitis: Secondary | ICD-10-CM | POA: Insufficient documentation

## 2022-01-05 DIAGNOSIS — J0110 Acute frontal sinusitis, unspecified: Secondary | ICD-10-CM | POA: Diagnosis not present

## 2022-01-05 MED ORDER — AZITHROMYCIN 250 MG PO TABS: ORAL_TABLET | ORAL | 0 refills | Status: AC

## 2022-01-05 MED ORDER — PREDNISONE 20 MG PO TABS: 40.0000 mg | ORAL_TABLET | Freq: Every day | ORAL | 0 refills | Status: AC

## 2022-01-05 NOTE — Assessment & Plan Note (Signed)
Acute for one week with ongoing symptoms.  Tested negative for Covid at home.  At this time will start Zpack and Prednisone 40 MG daily for 5 days.  Recommend: - Increased rest - Increasing Fluids - Acetaminophen / ibuprofen as needed for fever/pain.  - Salt water gargling, chloraseptic spray and throat lozenges - Mucinex.  - Humidifying the air.

## 2022-01-05 NOTE — Progress Notes (Signed)
Acute Office Visit  Subjective:     Patient ID: Jacqueline Salinas, female    DOB: [birth year], 51 y.o.   MRN: [id number]  Chief Complaint  Patient presents with   Cough   Congestion    Patient says she became symptomatic Tuesday of last week. Patient says it is a lot of drainage in her nasal area. Patient says she is blowing her nose a lot and just cough a little bit up. Patient says she has tried decongestants, Neti-Pot and etc. Patient says nothing seems to be helping. Patient says she took at-home COVID test on Saturday and it was negative. Patient says she has a headache cross her frontal lobe.    Headache    Started with symptoms one week ago.  Covid tested at home and was negative.  Initially started with sore throat and this progressed into upper respiratory symptoms.  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  Cough This is a new problem. The current episode started 1 to 4 weeks ago. The problem has been gradually improving. The problem occurs every few minutes. The cough is Productive of sputum. Associated symptoms include headaches, nasal congestion, postnasal drip, rhinorrhea and a sore throat. Pertinent negatives include no chest pain, chills, ear congestion, ear pain, fever, myalgias, rash, shortness of breath, sweats, weight loss or wheezing. The symptoms are aggravated by lying down. She has tried OTC cough suppressant and rest (Neti Pot and Afrin) for the symptoms. The treatment provided mild relief. There is no history of asthma or environmental allergies.   Patient is in today for URI symptoms starting one week ago.   Review of Systems  Constitutional:  Negative for chills, fever and weight loss.  HENT:  Positive for congestion, postnasal drip, rhinorrhea and sore throat. Negative for ear discharge, ear pain and hearing loss.   Respiratory:  Positive for cough. Negative for shortness of breath and wheezing.   Cardiovascular:  Negative for chest pain, palpitations and orthopnea.   Gastrointestinal: Negative.   Musculoskeletal:  Negative for myalgias.  Skin:  Negative for rash.  Neurological:  Positive for headaches.  Endo/Heme/Allergies:  Negative for environmental allergies.  Psychiatric/Behavioral: Negative.        Objective:    BP 120/78   Pulse 97   Temp 98.1 F (36.7 C) (Oral)   Wt 140 lb 3.2 oz (63.6 kg)   SpO2 99%   BMI 22.63 kg/m  BP Readings from Last 3 Encounters:  01/05/22 120/78  08/11/21 118/67  08/08/21 126/75   Wt Readings from Last 3 Encounters:  01/05/22 140 lb 3.2 oz (63.6 kg)  08/11/21 137 lb 12.8 oz (62.5 kg)  08/08/21 135 lb (61.2 kg)   Physical Exam Vitals and nursing note reviewed.  Constitutional:      General: She is awake. She is not in acute distress.    Appearance: She is well-developed. She is ill-appearing. She is not toxic-appearing.  HENT:     Head: Normocephalic.     Right Ear: Hearing, ear canal and external ear normal. A middle ear effusion is present.     Left Ear: Hearing, ear canal and external ear normal. A middle ear effusion is present.     Nose: Rhinorrhea present. Rhinorrhea is clear.     Right Sinus: Frontal sinus tenderness present. No maxillary sinus tenderness.     Left Sinus: Frontal sinus tenderness present. No maxillary sinus tenderness.     Mouth/Throat:     Mouth: Mucous membranes are moist.  Pharynx: Posterior oropharyngeal erythema (mild with cobblestone) present. No pharyngeal swelling or oropharyngeal exudate.  Eyes:     General: Lids are normal.        Right eye: No discharge.        Left eye: No discharge.     Conjunctiva/sclera: Conjunctivae normal.     Pupils: Pupils are equal, round, and reactive to light.  Neck:     Thyroid: No thyromegaly.     Vascular: No carotid bruit.  Cardiovascular:     Rate and Rhythm: Normal rate and regular rhythm.     Heart sounds: Normal heart sounds. No murmur heard.    No gallop.  Pulmonary:     Effort: Pulmonary effort is normal.      Breath sounds: Normal breath sounds.  Abdominal:     General: Bowel sounds are normal.     Palpations: Abdomen is soft.  Musculoskeletal:     Cervical back: Normal range of motion and neck supple.     Right lower leg: No edema.     Left lower leg: No edema.  Lymphadenopathy:     Cervical: No cervical adenopathy.  Skin:    General: Skin is warm and dry.  Neurological:     Mental Status: She is alert and oriented to person, place, and time.  Psychiatric:        Attention and Perception: Attention normal.        Mood and Affect: Mood normal.        Speech: Speech normal.        Behavior: Behavior normal. Behavior is cooperative.        Thought Content: Thought content normal.     No results found for any visits on 01/05/22.      Assessment & Plan:   Problem List Items Addressed This Visit       Respiratory   Frontal sinusitis    Acute for one week with ongoing symptoms.  Tested negative for Covid at home.  At this time will start Zpack and Prednisone 40 MG daily for 5 days.  Recommend: - Increased rest - Increasing Fluids - Acetaminophen / ibuprofen as needed for fever/pain.  - Salt water gargling, chloraseptic spray and throat lozenges - Mucinex.  - Humidifying the air.      Relevant Medications   azithromycin (ZITHROMAX) 250 MG tablet   predniSONE (DELTASONE) 20 MG tablet    Meds ordered this encounter  Medications   azithromycin (ZITHROMAX) 250 MG tablet    Sig: Take 2 tablets on day 1, then 1 tablet daily on days 2 through 5    Dispense:  6 tablet    Refill:  0   predniSONE (DELTASONE) 20 MG tablet    Sig: Take 2 tablets (40 mg total) by mouth daily with breakfast for 5 days.    Dispense:  10 tablet    Refill:  0    Return if symptoms worsen or fail to improve.  Venita Lick, NP

## 2022-02-23 ENCOUNTER — Encounter: Payer: Self-pay | Admitting: Unknown Physician Specialty

## 2022-02-23 ENCOUNTER — Telehealth: Payer: BC Managed Care – PPO | Admitting: Unknown Physician Specialty

## 2022-02-23 VITALS — Temp 98.0°F

## 2022-02-23 DIAGNOSIS — J111 Influenza due to unidentified influenza virus with other respiratory manifestations: Secondary | ICD-10-CM

## 2022-02-23 MED ORDER — OSELTAMIVIR PHOSPHATE 75 MG PO CAPS: 75.0000 mg | ORAL_CAPSULE | Freq: Two times a day (BID) | ORAL | 0 refills | Status: DC

## 2022-02-23 NOTE — Progress Notes (Signed)
   Temp 98 F (36.7 C) (Oral)    Subjective:    Patient ID: Jacqueline Salinas, female    DOB: 01/15/1971, 51 y.o.   MRN: [id number]  HPI: Jacqueline Salinas is a 51 y.o. female  Chief Complaint  Patient presents with   Cough    Nasal congestion, headache started yesterday. Daughter was Flu positive yesterday.   Due to the catastrophic nature of the COVID-19 pandemic, this visit was completed via audio and visual contact via [employer] due to the restrictions of the COVID-19 pandemic. All issues as above were discussed and addressed. Physical exam was done as above through visual confirmation on [facility]. If it was felt that the patient should be evaluated in the office, they were directed there. The patient verbally consented to this visit."} Location of the patient: home L  Time spent on call:  10 minutes on the phone discussing health concerns. 10 minutes total spent in review of patient's record and preparation of their chart. I verified patient identity using two factors (patient name and date of birth). Patient consents verbally to being seen via telemedicine visit today.  51 yo positive for influenza and no she is starting to feel ill  Influenza This is a new problem. The current episode started yesterday. The problem occurs constantly. The problem has been gradually worsening. Associated symptoms include congestion, coughing, fatigue, headaches and a sore throat. Pertinent negatives include no abdominal pain, anorexia, arthralgias, change in bowel habit, chest pain, chills, fever, myalgias, neck pain, vertigo or weakness. Nothing aggravates the symptoms. Treatments tried: saline spray, pseudophed.     Relevant past medical, surgical, family and social history reviewed and updated as indicated. Interim medical history since our last visit reviewed. Allergies and medications reviewed and updated.  Review of Systems  Constitutional:  Positive for fatigue. Negative for chills and fever.   HENT:  Positive for congestion and sore throat.   Respiratory:  Positive for cough.   Cardiovascular:  Negative for chest pain.  Gastrointestinal:  Negative for abdominal pain, anorexia and change in bowel habit.  Musculoskeletal:  Negative for arthralgias, myalgias and neck pain.  Neurological:  Positive for headaches. Negative for vertigo and weakness.    Per HPI unless specifically indicated above     Objective:    Temp 98 F (36.7 C) (Oral)   Wt Readings from Last 3 Encounters:  01/05/22 140 lb 3.2 oz (63.6 kg)  08/11/21 137 lb 12.8 oz (62.5 kg)  08/08/21 135 lb (61.2 kg)    Pt alert and oriented with mild dry cough.  No distress.  Breathing is non-labored No rash  Results for orders placed or performed in visit on 09/09/21  Cologuard  Result Value Ref Range   Cologuard        Assessment & Plan:   Problem List Items Addressed This Visit   None Visit Diagnoses     Influenza    -  Primary   Daughter diagnosed yesterday and now she is having symptoms.  Rx for Tamiflu.  Rest and fluids   Relevant Medications   oseltamivir (TAMIFLU) 75 MG capsule        Follow up plan: Return if symptoms worsen or fail to improve.

## 2022-03-02 ENCOUNTER — Telehealth: Payer: BC Managed Care – PPO | Admitting: Family Medicine

## 2022-03-02 DIAGNOSIS — B9689 Other specified bacterial agents as the cause of diseases classified elsewhere: Secondary | ICD-10-CM | POA: Diagnosis not present

## 2022-03-02 DIAGNOSIS — J019 Acute sinusitis, unspecified: Secondary | ICD-10-CM

## 2022-03-02 MED ORDER — AMOXICILLIN-POT CLAVULANATE 875-125 MG PO TABS: 1.0000 | ORAL_TABLET | Freq: Two times a day (BID) | ORAL | 0 refills | Status: AC

## 2022-03-02 NOTE — Progress Notes (Signed)

## 2022-03-27 ENCOUNTER — Ambulatory Visit: Payer: BC Managed Care – PPO

## 2022-03-27 ENCOUNTER — Telehealth: Payer: BC Managed Care – PPO | Admitting: Nurse Practitioner

## 2022-03-27 DIAGNOSIS — B9789 Other viral agents as the cause of diseases classified elsewhere: Secondary | ICD-10-CM

## 2022-03-27 DIAGNOSIS — J019 Acute sinusitis, unspecified: Secondary | ICD-10-CM

## 2022-03-27 MED ORDER — FLUTICASONE PROPIONATE 50 MCG/ACT NA SUSP: 2.0000 | Freq: Every day | NASAL | 6 refills | Status: DC

## 2022-03-27 NOTE — Progress Notes (Signed)
E-Visit for Sinus Problems  We are sorry that you are not feeling well.  Here is how we plan to help!  Based on what you have shared with me it looks like you have sinusitis.  Sinusitis is inflammation and infection in the sinus cavities of the head.  Based on your presentation I believe you most likely have Acute Viral Sinusitis.This is an infection most likely caused by a virus. There is not specific treatment for viral sinusitis other than to help you with the symptoms until the infection runs its course.  You may use an oral decongestant such as Mucinex D or if you have glaucoma or high blood pressure use plain Mucinex. Saline nasal spray help and can safely be used as often as needed for congestion, I have prescribed: Fluticasone nasal spray two sprays in each nostril once a day  It appears hat you have had frequent sinus infections over the last year. I would suggest you see your PCP and get a referral to ENT to see if there is a cause for this. Some authorities believe that zinc sprays or the use of Echinacea may shorten the course of your symptoms.  Sinus infections are not as easily transmitted as other respiratory infection, however we still recommend that you avoid close contact with loved ones, especially the very young and elderly.  Remember to wash your hands thoroughly throughout the day as this is the number one way to prevent the spread of infection!  Home Care: Only take medications as instructed by your medical team. Do not take these medications with alcohol. A steam or ultrasonic humidifier can help congestion.  You can place a towel over your head and breathe in the steam from hot water coming from a faucet. Avoid close contacts especially the very young and the elderly. Cover your mouth when you cough or sneeze. Always remember to wash your hands.  Get Help Right Away If: You develop worsening fever or sinus pain. You develop a severe head ache or visual changes. Your  symptoms persist after you have completed your treatment plan.  Make sure you Understand these instructions. Will watch your condition. Will get help right away if you are not doing well or get worse.   Thank you for choosing an e-visit.  Your e-visit answers were reviewed by a board certified advanced clinical practitioner to complete your personal care plan. Depending upon the condition, your plan could have included both over the counter or prescription medications.  Please review your pharmacy choice. Make sure the pharmacy is open so you can pick up prescription now. If there is a problem, you may contact your provider through [employer] and have the prescription routed to another pharmacy.  Your safety is important to [place]. If you have drug allergies check your prescription carefully.   For the next 24 hours you can use MyChart to ask questions about today's visit, request a non-urgent call back, or ask for a work or school excuse. You will get an email in the next two days asking about your experience. I hope that your e-visit has been valuable and will speed your recovery.  Mary-Margaret Hassell Done, FNP   5-10 minutes spent reviewing and documenting in chart.

## 2022-03-29 ENCOUNTER — Ambulatory Visit (INDEPENDENT_AMBULATORY_CARE_PROVIDER_SITE_OTHER): Payer: BC Managed Care – PPO | Admitting: Family Medicine

## 2022-03-29 ENCOUNTER — Encounter: Payer: Self-pay | Admitting: Family Medicine

## 2022-03-29 VITALS — BP 126/76 | HR 102 | Temp 98.2°F | Ht 66.0 in | Wt 141.3 lb

## 2022-03-29 DIAGNOSIS — J029 Acute pharyngitis, unspecified: Secondary | ICD-10-CM | POA: Insufficient documentation

## 2022-03-29 DIAGNOSIS — J069 Acute upper respiratory infection, unspecified: Secondary | ICD-10-CM

## 2022-03-29 MED ORDER — PREDNISONE 50 MG PO TABS: 50.0000 mg | ORAL_TABLET | Freq: Every day | ORAL | 0 refills | Status: DC

## 2022-03-29 MED ORDER — BENZONATATE 200 MG PO CAPS: 200.0000 mg | ORAL_CAPSULE | Freq: Two times a day (BID) | ORAL | 0 refills | Status: DC | PRN

## 2022-03-29 NOTE — Progress Notes (Signed)
BP 126/76   Pulse (!) 102   Temp 98.2 F (36.8 C) (Oral)   Ht 5\' 6"  (1.676 m)   Wt 141 lb 4.8 oz (64.1 kg)   SpO2 100%   BMI 22.81 kg/m    Subjective:    Patient ID: Jacqueline Salinas, female    DOB: 09/09/1970, 52 y.o.   MRN: [id number]  HPI: Jacqueline Salinas is a 52 y.o. female  Chief Complaint  Patient presents with   Cough    Patient says she is became symptomatic since Thursday. Patient says she has tried Mucinex, Sudafed and Tylenol/Ibuprofen.    Headache   Nasal Congestion   Sore Throat   UPPER RESPIRATORY TRACT INFECTION Duration: 4-5 days Worst symptom: headache Fever: no Cough: yes Shortness of breath: no Wheezing: no Chest pain: no Chest tightness: no Chest congestion: no Nasal congestion: yes Runny nose: yes Post nasal drip: yes Sneezing: yes Sore throat: yes Swollen glands: no Sinus pressure: yes Headache: yes Face pain: no Toothache: no Ear pain: no  Ear pressure: no  Eyes red/itching:no Eye drainage/crusting: no  Vomiting: no Rash: no Fatigue: yes Sick contacts: yes Strep contacts: no  Context: worse Recurrent sinusitis: no Relief with OTC cold/cough medications: no  Treatments attempted: tylenol   Relevant past medical, surgical, family and social history reviewed and updated as indicated. Interim medical history since our last visit reviewed. Allergies and medications reviewed and updated.  Review of Systems  Constitutional:  Positive for fatigue. Negative for activity change, appetite change, chills, diaphoresis, fever and unexpected weight change.  HENT:  Positive for congestion, postnasal drip, rhinorrhea, sinus pressure and sinus pain. Negative for dental problem, drooling, ear discharge, ear pain, facial swelling, hearing loss, mouth sores, nosebleeds, sneezing, sore throat, tinnitus, trouble swallowing and voice change.   Eyes: Negative.   Respiratory:  Positive for cough. Negative for apnea, choking, chest tightness, shortness of  breath, wheezing and stridor.   Cardiovascular: Negative.   Gastrointestinal: Negative.   Psychiatric/Behavioral: Negative.      Per HPI unless specifically indicated above     Objective:    BP 126/76   Pulse (!) 102   Temp 98.2 F (36.8 C) (Oral)   Ht 5\' 6"  (1.676 m)   Wt 141 lb 4.8 oz (64.1 kg)   SpO2 100%   BMI 22.81 kg/m   Wt Readings from Last 3 Encounters:  03/29/22 141 lb 4.8 oz (64.1 kg)  01/05/22 140 lb 3.2 oz (63.6 kg)  08/11/21 137 lb 12.8 oz (62.5 kg)    Physical Exam Vitals and nursing note reviewed.  Constitutional:      General: She is not in acute distress.    Appearance: Normal appearance. She is not ill-appearing, toxic-appearing or diaphoretic.  HENT:     Head: Normocephalic and atraumatic.     Right Ear: External ear normal.     Left Ear: External ear normal.     Nose: Nose normal.     Mouth/Throat:     Mouth: Mucous membranes are moist.     Pharynx: Oropharynx is clear.  Eyes:     General: No scleral icterus.       Right eye: No discharge.        Left eye: No discharge.     Extraocular Movements: Extraocular movements intact.     Conjunctiva/sclera: Conjunctivae normal.     Pupils: Pupils are equal, round, and reactive to light.  Cardiovascular:     Rate and Rhythm: Normal  rate and regular rhythm.     Pulses: Normal pulses.     Heart sounds: Normal heart sounds. No murmur heard.    No friction rub. No gallop.  Pulmonary:     Effort: Pulmonary effort is normal. No respiratory distress.     Breath sounds: Normal breath sounds. No stridor. No wheezing, rhonchi or rales.  Chest:     Chest wall: No tenderness.  Musculoskeletal:        General: Normal range of motion.     Cervical back: Normal range of motion and neck supple.  Skin:    General: Skin is warm and dry.     Capillary Refill: Capillary refill takes less than 2 seconds.     Coloration: Skin is not jaundiced or pale.     Findings: No bruising, erythema, lesion or rash.   Neurological:     General: No focal deficit present.     Mental Status: She is alert and oriented to person, place, and time. Mental status is at baseline.  Psychiatric:        Mood and Affect: Mood normal.        Behavior: Behavior normal.        Thought Content: Thought content normal.        Judgment: Judgment normal.     Results for orders placed or performed in visit on 09/09/21  Cologuard  Result Value Ref Range   Cologuard        Assessment & Plan:   Problem List Items Addressed This Visit   None Visit Diagnoses     Upper respiratory tract infection, unspecified type    -  Primary   Flu and strep negative. Will treat with prednisone and tesslon. Await COVID- if positive, will send in paxlovid. Await results.   Relevant Orders   Influenza A & B ([facility])   Novel Coronavirus, NAA ([facility])   Rapid Strep screen([facility])        Follow up plan: Return if symptoms worsen or fail to improve.

## 2022-03-30 LAB — NOVEL CORONAVIRUS, NAA: SARS-CoV-2, NAA: NOT DETECTED

## 2022-03-31 ENCOUNTER — Encounter: Payer: Self-pay | Admitting: Family Medicine

## 2022-03-31 ENCOUNTER — Ambulatory Visit: Payer: BC Managed Care – PPO | Admitting: Nurse Practitioner

## 2022-04-01 ENCOUNTER — Telehealth: Payer: Self-pay | Admitting: Family Medicine

## 2022-04-01 LAB — CULTURE, GROUP A STREP: Strep A Culture: NEGATIVE

## 2022-04-01 LAB — RAPID STREP SCREEN (MED CTR MEBANE ONLY): Strep Gp A Ag, IA W/Reflex: NEGATIVE

## 2022-04-01 LAB — VERITOR FLU A/B WAIVED
Influenza A: NEGATIVE
Influenza B: NEGATIVE

## 2022-04-01 MED ORDER — DOXYCYCLINE HYCLATE 100 MG PO TABS: 100.0000 mg | ORAL_TABLET | Freq: Two times a day (BID) | ORAL | 0 refills | Status: DC

## 2022-04-01 NOTE — Telephone Encounter (Signed)
Doxycyline sent to her pharmacy.

## 2022-04-01 NOTE — Telephone Encounter (Unsigned)
Copied from [place] [id number]. Topic: General - Other >> Mar 31, 2022  5:04 PM Sabas Sous wrote: Reason for CRM: Pt called to check status of her request via [facility]! Willing to come in, just wants to know if she can be prescribed an antibiotic or if she needs another appt.

## 2022-04-01 NOTE — Telephone Encounter (Signed)
Pt has called back responding to her Mychart message from yesterday morning, feels is getting worse as she has got a thick green mucus and still has the headache and felt she may need an antibiotic, wanted to make sure her message had been received, called office/ fu [phone]

## 2022-04-02 NOTE — Telephone Encounter (Signed)
See other telephone encounter.

## 2022-10-12 ENCOUNTER — Other Ambulatory Visit: Payer: Self-pay | Admitting: Nurse Practitioner

## 2022-10-19 ENCOUNTER — Telehealth: Payer: BC Managed Care – PPO | Admitting: Nurse Practitioner

## 2022-10-19 DIAGNOSIS — J0140 Acute pansinusitis, unspecified: Secondary | ICD-10-CM | POA: Diagnosis not present

## 2022-10-19 MED ORDER — DOXYCYCLINE HYCLATE 100 MG PO TABS: 100.0000 mg | ORAL_TABLET | Freq: Two times a day (BID) | ORAL | 0 refills | Status: AC

## 2022-10-19 MED ORDER — FLUTICASONE PROPIONATE 50 MCG/ACT NA SUSP: 2.0000 | Freq: Every day | NASAL | 6 refills | Status: DC

## 2022-10-19 NOTE — Progress Notes (Signed)
E-Visit for Sinus Problems  We are sorry that you are not feeling well.  Here is how we plan to help!  Based on what you have shared with me it looks like you have sinusitis.  Sinusitis is inflammation and infection in the sinus cavities of the head.  Based on your presentation I believe you most likely have Acute Bacterial Sinusitis.  This is an infection caused by bacteria and is treated with antibiotics. I have prescribed Doxycycline 100mg  by mouth twice a day for 10 days.  We typically recommend waiting 7-10 days from symptom onset prior to starting an antibiotic. If you are not using a nasal spray you may want to start using Flonase, and see if you have improvement in the next 48 hours prior to starting your antibiotic.    You may use an oral decongestant such as Mucinex D or if you have glaucoma or high blood pressure use plain Mucinex. Saline nasal spray help and can safely be used as often as needed for congestion.  If you develop worsening sinus pain, fever or notice severe headache and vision changes, or if symptoms are not better after completion of antibiotic, please schedule an appointment with a health care provider.    Sinus infections are not as easily transmitted as other respiratory infection, however we still recommend that you avoid close contact with loved ones, especially the very young and elderly.  Remember to wash your hands thoroughly throughout the day as this is the number one way to prevent the spread of infection!  Home Care: Only take medications as instructed by your medical team. Complete the entire course of an antibiotic. Do not take these medications with alcohol. A steam or ultrasonic humidifier can help congestion.  You can place a towel over your head and breathe in the steam from hot water coming from a faucet. Avoid close contacts especially the very young and the elderly. Cover your mouth when you cough or sneeze. Always remember to wash your hands.  Get  Help Right Away If: You develop worsening fever or sinus pain. You develop a severe head ache or visual changes. Your symptoms persist after you have completed your treatment plan.  Make sure you Understand these instructions. Will watch your condition. Will get help right away if you are not doing well or get worse.  Thank you for choosing an e-visit.  Your e-visit answers were reviewed by a board certified advanced clinical practitioner to complete your personal care plan. Depending upon the condition, your plan could have included both over the counter or prescription medications.  Please review your pharmacy choice. Make sure the pharmacy is open so you can pick up prescription now. If there is a problem, you may contact your provider through [employer] and have the prescription routed to another pharmacy.  Your safety is important to [place]. If you have drug allergies check your prescription carefully.   For the next 24 hours you can use MyChart to ask questions about today's visit, request a non-urgent call back, or ask for a work or school excuse. You will get an email in the next two days asking about your experience. I hope that your e-visit has been valuable and will speed your recovery.   Meds ordered this encounter  Medications   doxycycline (VIBRA-TABS) 100 MG tablet    Sig: Take 1 tablet (100 mg total) by mouth 2 (two) times daily for 10 days.    Dispense:  20 tablet  Refill:  0   fluticasone (FLONASE) 50 MCG/ACT nasal spray    Sig: Place 2 sprays into both nostrils daily.    Dispense:  16 g    Refill:  6     I spent approximately 5 minutes reviewing the patient's history, current symptoms and coordinating their care today.

## 2022-11-29 ENCOUNTER — Encounter: Payer: BC Managed Care – PPO | Admitting: Family Medicine

## 2022-12-23 ENCOUNTER — Ambulatory Visit: Payer: BC Managed Care – PPO | Admitting: Family Medicine

## 2022-12-23 ENCOUNTER — Encounter: Payer: Self-pay | Admitting: Family Medicine

## 2022-12-23 VITALS — BP 142/80 | HR 96 | Ht 65.5 in | Wt 141.4 lb

## 2022-12-23 DIAGNOSIS — Z1231 Encounter for screening mammogram for malignant neoplasm of breast: Secondary | ICD-10-CM

## 2022-12-23 DIAGNOSIS — Z23 Encounter for immunization: Secondary | ICD-10-CM

## 2022-12-23 DIAGNOSIS — Z0000 Encounter for general adult medical examination without abnormal findings: Secondary | ICD-10-CM

## 2022-12-23 DIAGNOSIS — Z1211 Encounter for screening for malignant neoplasm of colon: Secondary | ICD-10-CM

## 2022-12-23 MED ORDER — AMLODIPINE BESYLATE 2.5 MG PO TABS: ORAL_TABLET | ORAL | 3 refills | Status: DC

## 2022-12-23 NOTE — Progress Notes (Signed)
BP (!) 142/80   Pulse 96   Ht 5' 5.5" (1.664 m)   Wt 141 lb 6.4 oz (64.1 kg)   SpO2 95%   BMI 23.17 kg/m    Subjective:    Patient ID: Jacqueline Salinas, female    DOB: Sep 24, 1970, 51 y.o.   MRN: [id number]  HPI: Jacqueline Salinas is a 52 y.o. female presenting on 12/23/2022 for comprehensive medical examination. Current medical complaints include: Raynaulds still doing well with amlodipine. No concerns  Menopausal Symptoms: no  Depression Screen done today and results listed below:     12/23/2022    3:30 PM 12/23/2022    3:29 PM 03/29/2022    1:14 PM 01/05/2022    4:35 PM 08/11/2021    3:19 PM  Depression screen PHQ 2/9  Decreased Interest 0 0 0 0 0  Down, Depressed, Hopeless 0 0 0 0 0  PHQ - 2 Score 0 0 0 0 0  Altered sleeping 1 0 0 0 0  Tired, decreased energy 0 0 0 0 1  Change in appetite 0 0 0 0 0  Feeling bad or failure about yourself  0 0 0 0 0  Trouble concentrating 0 0 0 0 0  Moving slowly or fidgety/restless 0 0 0 0 0  Suicidal thoughts 0 0 0 0 0  PHQ-9 Score 1 0 0 0 1  Difficult doing work/chores Not difficult at all Not difficult at all Not difficult at all Not difficult at all Not difficult at all   Past Medical History:  Past Medical History:  Diagnosis Date   Anxiety    COVID-19 10/2019   Depression    Migraine with acute onset aura    Migraines     Surgical History:  History reviewed. No pertinent surgical history.  Medications:  Current Outpatient Medications on File Prior to Visit  Medication Sig   amphetamine-dextroamphetamine (ADDERALL XR) 30 MG 24 hr capsule TK ONE C PO QAM   amphetamine-dextroamphetamine (ADDERALL) 20 MG tablet Take 20 mg by mouth daily.   buPROPion (WELLBUTRIN SR) 100 MG 12 hr tablet Take 100 mg by mouth 2 (two) times daily.   Cholecalciferol (VITAMIN D3) 1.25 MG (50000 UT) CAPS Take 1 capsule by mouth once a week.   clonazePAM (KLONOPIN) 0.5 MG tablet TK SS T PO BID PRN   drospirenone-ethinyl estradiol (YAZ,GIANVI,LORYNA)  3-0.02 MG tablet Take 1 tablet by mouth daily.   ketoconazole (NIZORAL) 2 % shampoo Apply topically 2 (two) times a week.   No current facility-administered medications on file prior to visit.    Allergies:  No Known Allergies  Social History:  Social History   Socioeconomic History   Marital status: Married    Spouse name: Not on file   Number of children: Not on file   Years of education: Not on file   Highest education level: Not on file  Occupational History   Not on file  Tobacco Use   Smoking status: Never   Smokeless tobacco: Never  Vaping Use   Vaping status: Never Used  Substance and Sexual Activity   Alcohol use: Yes    Alcohol/week: 0.0 standard drinks of alcohol    Comment: Rare   Drug use: No   Sexual activity: Yes    Birth control/protection: Pill  Other Topics Concern   Not on file  Social History Narrative   Not on file   Social Determinants of Health   Financial Resource Strain: Not on file  Food Insecurity: Not on file  Transportation Needs: Not on file  Physical Activity: Not on file  Stress: Not on file  Social Connections: Not on file  Intimate Partner Violence: Not on file   Social History   Tobacco Use  Smoking Status Never  Smokeless Tobacco Never   Social History   Substance and Sexual Activity  Alcohol Use Yes   Alcohol/week: 0.0 standard drinks of alcohol   Comment: Rare    Family History:  Family History  Problem Relation Age of Onset   Migraines Mother    Dementia Maternal Grandmother    Lung disease Maternal Grandfather    Alzheimer's disease Paternal Grandfather     Past medical history, surgical history, medications, allergies, family history and social history reviewed with patient today and changes made to appropriate areas of the chart.   Review of Systems  Constitutional: Negative.   HENT: Negative.    Eyes: Negative.   Respiratory: Negative.    Cardiovascular: Negative.   Gastrointestinal: Negative.    Genitourinary: Negative.   Musculoskeletal: Negative.   Skin: Negative.   Neurological: Negative.   Endo/Heme/Allergies:  Positive for environmental allergies. Negative for polydipsia. Does not bruise/bleed easily.  Psychiatric/Behavioral: Negative.     All other ROS negative except what is listed above and in the HPI.      Objective:    BP (!) 142/80   Pulse 96   Ht 5' 5.5" (1.664 m)   Wt 141 lb 6.4 oz (64.1 kg)   SpO2 95%   BMI 23.17 kg/m   Wt Readings from Last 3 Encounters:  12/23/22 141 lb 6.4 oz (64.1 kg)  03/29/22 141 lb 4.8 oz (64.1 kg)  01/05/22 140 lb 3.2 oz (63.6 kg)    Physical Exam Vitals and nursing note reviewed.  Constitutional:      General: She is not in acute distress.    Appearance: Normal appearance. She is not ill-appearing, toxic-appearing or diaphoretic.  HENT:     Head: Normocephalic and atraumatic.     Right Ear: Tympanic membrane, ear canal and external ear normal. There is no impacted cerumen.     Left Ear: Tympanic membrane, ear canal and external ear normal. There is no impacted cerumen.     Nose: Nose normal. No congestion or rhinorrhea.     Mouth/Throat:     Mouth: Mucous membranes are moist.     Pharynx: Oropharynx is clear. No oropharyngeal exudate or posterior oropharyngeal erythema.  Eyes:     General: No scleral icterus.       Right eye: No discharge.        Left eye: No discharge.     Extraocular Movements: Extraocular movements intact.     Conjunctiva/sclera: Conjunctivae normal.     Pupils: Pupils are equal, round, and reactive to light.  Neck:     Vascular: No carotid bruit.  Cardiovascular:     Rate and Rhythm: Normal rate and regular rhythm.     Pulses: Normal pulses.     Heart sounds: No murmur heard.    No friction rub. No gallop.  Pulmonary:     Effort: Pulmonary effort is normal. No respiratory distress.     Breath sounds: Normal breath sounds. No stridor. No wheezing, rhonchi or rales.  Chest:     Chest wall: No  tenderness.  Abdominal:     General: Abdomen is flat. Bowel sounds are normal. There is no distension.     Palpations: Abdomen is soft. There  is no mass.     Tenderness: There is no abdominal tenderness. There is no right CVA tenderness, left CVA tenderness, guarding or rebound.     Hernia: No hernia is present.  Genitourinary:    Comments: Breast and pelvic exams deferred with shared decision making Musculoskeletal:        General: No swelling, tenderness, deformity or signs of injury.     Cervical back: Normal range of motion and neck supple. No rigidity. No muscular tenderness.     Right lower leg: No edema.     Left lower leg: No edema.  Lymphadenopathy:     Cervical: No cervical adenopathy.  Skin:    General: Skin is warm and dry.     Capillary Refill: Capillary refill takes less than 2 seconds.     Coloration: Skin is not jaundiced or pale.     Findings: No bruising, erythema, lesion or rash.  Neurological:     General: No focal deficit present.     Mental Status: She is alert and oriented to person, place, and time. Mental status is at baseline.     Cranial Nerves: No cranial nerve deficit.     Sensory: No sensory deficit.     Motor: No weakness.     Coordination: Coordination normal.     Gait: Gait normal.     Deep Tendon Reflexes: Reflexes normal.  Psychiatric:        Mood and Affect: Mood normal.        Behavior: Behavior normal.        Thought Content: Thought content normal.        Judgment: Judgment normal.     Results for orders placed or performed in visit on 03/29/22  Novel Coronavirus, NAA ([facility])   Specimen: Nasopharyngeal(NP) swabs in vial transport medium  Result Value Ref Range   SARS-CoV-2, NAA Not Detected Not Detected  Rapid Strep screen([facility])   Specimen: Other   Other  Result Value Ref Range   Strep Gp A Ag, IA W/Reflex Negative Negative  Culture, Group A Strep   Other  Result Value Ref Range   Strep A Culture Negative    Influenza A & B (STAT)  Result Value Ref Range   Influenza A Negative Negative   Influenza B Negative Negative      Assessment & Plan:   Problem List Items Addressed This Visit   None Visit Diagnoses     Routine general medical examination at a health care facility    -  Primary   Vaccines up to date/declined. Screening labs checked today. Pap through GYN. Mammogram and cologuard ordered. Continue diet and exercise. Call with any concerns   Relevant Orders   CBC with Differential/Platelet   Comprehensive metabolic panel   Lipid Panel w/o Chol/HDL Ratio   TSH   Encounter for screening mammogram for malignant neoplasm of breast       Mammogram ordered today.   Relevant Orders   MM 3D SCREENING MAMMOGRAM BILATERAL BREAST   Screening for colon cancer       Cologuard ordered today   Relevant Orders   Cologuard   Needs flu shot       Flu shot given today.   Relevant Orders   Flu vaccine trivalent PF, 6mos and older(Flulaval,Afluria,Fluarix,Fluzone)        Follow up plan: Return for Records release Wyatt Mage (pap and Mammo specifically).   LABORATORY TESTING:  - Pap smear: done elsewhere  IMMUNIZATIONS:   -  Tdap: Tetanus vaccination status reviewed: last tetanus booster within 10 years. - Influenza: Administered today - Pneumovax: Not applicable - Prevnar: Not applicable - COVID: Refused - HPV: Not applicable - Shingrix vaccine: Refused  SCREENING: -Mammogram: Ordered today  - Colonoscopy: Cologuard ordered today   PATIENT COUNSELING:   Advised to take 1 mg of folate supplement per day if capable of pregnancy.   Sexuality: Discussed sexually transmitted diseases, partner selection, use of condoms, avoidance of unintended pregnancy  and contraceptive alternatives.   Advised to avoid cigarette smoking.  I discussed with the patient that most people either abstain from alcohol or drink within safe limits (<=14/week and <=4 drinks/occasion for males, <=7/weeks  and <= 3 drinks/occasion for females) and that the risk for alcohol disorders and other health effects rises proportionally with the number of drinks per week and how often a drinker exceeds daily limits.  Discussed cessation/primary prevention of drug use and availability of treatment for abuse.   Diet: Encouraged to adjust caloric intake to maintain  or achieve ideal body weight, to reduce intake of dietary saturated fat and total fat, to limit sodium intake by avoiding high sodium foods and not adding table salt, and to maintain adequate dietary potassium and calcium preferably from fresh fruits, vegetables, and low-fat dairy products.    stressed the importance of regular exercise  Injury prevention: Discussed safety belts, safety helmets, smoke detector, smoking near bedding or upholstery.   Dental health: Discussed importance of regular tooth brushing, flossing, and dental visits.    NEXT PREVENTATIVE PHYSICAL DUE IN 1 YEAR. Return for Records release Wyatt Mage (pap and Mammo specifically).

## 2022-12-23 NOTE — Patient Instructions (Signed)
Please call to schedule your mammogram and/or bone density: [facility]  Address: [address], [place], [place] [address] Phone: [phone]  [facility] [place] [address] [place],  [place]  [address] Phone: [phone]   

## 2022-12-24 LAB — CBC WITH DIFFERENTIAL/PLATELET
Basophils Absolute: 0.1 10*3/uL (ref 0.0–0.2)
Basos: 1 %
EOS (ABSOLUTE): 0.2 10*3/uL (ref 0.0–0.4)
Eos: 2 %
Hematocrit: 40.7 % (ref 34.0–46.6)
Hemoglobin: 13.4 g/dL (ref 11.1–15.9)
Immature Grans (Abs): 0 10*3/uL (ref 0.0–0.1)
Immature Granulocytes: 0 %
Lymphocytes Absolute: 2.9 10*3/uL (ref 0.7–3.1)
Lymphs: 31 %
MCH: 30.8 pg (ref 26.6–33.0)
MCHC: 32.9 g/dL (ref 31.5–35.7)
MCV: 94 fL (ref 79–97)
Monocytes Absolute: 0.6 10*3/uL (ref 0.1–0.9)
Monocytes: 7 %
Neutrophils Absolute: 5.7 10*3/uL (ref 1.4–7.0)
Neutrophils: 59 %
Platelets: 370 10*3/uL (ref 150–450)
RBC: 4.35 x10E6/uL (ref 3.77–5.28)
RDW: 12.4 % (ref 11.7–15.4)
WBC: 9.5 10*3/uL (ref 3.4–10.8)

## 2022-12-24 LAB — COMPREHENSIVE METABOLIC PANEL
ALT: 14 [IU]/L (ref 0–32)
AST: 26 [IU]/L (ref 0–40)
Albumin: 4.5 g/dL (ref 3.8–4.9)
Alkaline Phosphatase: 62 [IU]/L (ref 44–121)
BUN/Creatinine Ratio: 34 — ABNORMAL HIGH (ref 9–23)
BUN: 27 mg/dL — ABNORMAL HIGH (ref 6–24)
Bilirubin Total: <0.2 mg/dL (ref 0.0–1.2)
CO2: 19 mmol/L — ABNORMAL LOW (ref 20–29)
Calcium: 9.3 mg/dL (ref 8.7–10.2)
Chloride: 98 mmol/L (ref 96–106)
Creatinine, Ser: 0.8 mg/dL (ref 0.57–1.00)
Globulin, Total: 2.2 g/dL (ref 1.5–4.5)
Glucose: 85 mg/dL (ref 70–99)
Potassium: 4.1 mmol/L (ref 3.5–5.2)
Sodium: 135 mmol/L (ref 134–144)
Total Protein: 6.7 g/dL (ref 6.0–8.5)
eGFR: 89 mL/min/{1.73_m2} (ref >59)

## 2022-12-24 LAB — LIPID PANEL W/O CHOL/HDL RATIO
Cholesterol, Total: 219 mg/dL — ABNORMAL HIGH (ref 100–199)
HDL: 107 mg/dL (ref >39)
LDL Chol Calc (NIH): 95 mg/dL (ref 0–99)
Triglycerides: 99 mg/dL (ref 0–149)
VLDL Cholesterol Cal: 17 mg/dL (ref 5–40)

## 2022-12-24 LAB — TSH: TSH: 1.34 u[IU]/mL (ref 0.450–4.500)

## 2023-04-17 ENCOUNTER — Other Ambulatory Visit: Payer: Self-pay | Admitting: Nurse Practitioner

## 2023-04-17 DIAGNOSIS — J0140 Acute pansinusitis, unspecified: Secondary | ICD-10-CM

## 2023-05-09 ENCOUNTER — Telehealth: Payer: Self-pay | Admitting: Family Medicine

## 2023-05-09 DIAGNOSIS — J019 Acute sinusitis, unspecified: Secondary | ICD-10-CM | POA: Diagnosis not present

## 2023-05-09 DIAGNOSIS — B9689 Other specified bacterial agents as the cause of diseases classified elsewhere: Secondary | ICD-10-CM

## 2023-05-09 MED ORDER — AMOXICILLIN-POT CLAVULANATE 875-125 MG PO TABS: 1.0000 | ORAL_TABLET | Freq: Two times a day (BID) | ORAL | 0 refills | Status: AC

## 2023-05-09 NOTE — Progress Notes (Signed)

## 2023-06-14 ENCOUNTER — Ambulatory Visit: Admitting: Nurse Practitioner

## 2023-06-14 VITALS — BP 115/73 | HR 101 | Temp 98.9°F | Resp 17 | Ht 65.51 in | Wt 138.0 lb

## 2023-06-14 DIAGNOSIS — J0110 Acute frontal sinusitis, unspecified: Secondary | ICD-10-CM

## 2023-06-14 MED ORDER — AMOXICILLIN 500 MG PO CAPS: 500.0000 mg | ORAL_CAPSULE | Freq: Two times a day (BID) | ORAL | 0 refills | Status: AC

## 2023-06-14 NOTE — Progress Notes (Unsigned)
   BP 115/73 (BP Location: Right Arm, Patient Position: Sitting, Cuff Size: Normal)   Pulse (!) 101   Temp 98.9 F (37.2 C) (Oral)   Resp 17   Ht 5' 5.51" (1.664 m)   Wt 138 lb (62.6 kg)   SpO2 99%   BMI 22.61 kg/m    Subjective:    Patient ID: Jacqueline Salinas, female    DOB: 1970/04/24, 53 y.o.   MRN: [id number]  HPI: Jacqueline Salinas is a 53 y.o. female  Chief Complaint  Patient presents with   URI    Started 2 weeks ago. Congestion and cough. Congestion has gotten worse, can't breathe, yellow mucous. Sinus headaches, sinus pressure, eyebrows hurt, no fevers, post nasal drip. Coughing at night, last 2 nights has woke her up.  Meds tried include Sudafed, allegra, Claritin, fluticasone, saline spray and neti pot, tylenol, naproxen. Sudafed, allegra and naproxen seemed to help some.    UPPER RESPIRATORY TRACT INFECTION Worst symptom: symptoms started about 2 weeks ago. Fever: no Cough: yes Shortness of breath: no Wheezing: no Chest pain: no Chest tightness: no Chest congestion: no Nasal congestion: yes Runny nose: yes Post nasal drip: yes Sneezing: yes Sore throat: no Swollen glands: no Sinus pressure: yes Headache: yes Face pain: yes Toothache: no Ear pain: no bilateral Ear pressure: no bilateral Eyes red/itching:no Eye drainage/crusting: no  Vomiting: no Rash: no Fatigue: yes Sick contacts: yes Strep contacts: no  Context: stable Recurrent sinusitis: no Relief with OTC cold/cough medications: yes  Treatments attempted: anti-histamine and pseudoephedrine   Relevant past medical, surgical, family and social history reviewed and updated as indicated. Interim medical history since our last visit reviewed. Allergies and medications reviewed and updated.  Review of Systems  Per HPI unless specifically indicated above     Objective:    BP 115/73 (BP Location: Right Arm, Patient Position: Sitting, Cuff Size: Normal)   Pulse (!) 101   Temp 98.9 F (37.2 C)  (Oral)   Resp 17   Ht 5' 5.51" (1.664 m)   Wt 138 lb (62.6 kg)   SpO2 99%   BMI 22.61 kg/m   Wt Readings from Last 3 Encounters:  06/14/23 138 lb (62.6 kg)  12/23/22 141 lb 6.4 oz (64.1 kg)  03/29/22 141 lb 4.8 oz (64.1 kg)    Physical Exam  Results for orders placed or performed in visit on 03/29/23  HM PAP SMEAR   Collection Time: 03/12/20 12:00 AM  Result Value Ref Range   HM Pap smear See Report in Chart       Assessment & Plan:   Problem List Items Addressed This Visit   None    Follow up plan: No follow-ups on file.

## 2023-06-15 NOTE — Assessment & Plan Note (Signed)
 Acute for one week with ongoing symptoms.   At this time will start Amoxicillin.   Recommend: - Increased rest - Increasing Fluids - Acetaminophen / ibuprofen as needed for fever/pain.  - Salt water gargling, chloraseptic spray and throat lozenges - Mucinex.  - Humidifying the air.

## 2023-11-09 ENCOUNTER — Telehealth: Admitting: Physician Assistant

## 2023-11-09 DIAGNOSIS — B9689 Other specified bacterial agents as the cause of diseases classified elsewhere: Secondary | ICD-10-CM | POA: Diagnosis not present

## 2023-11-09 DIAGNOSIS — J019 Acute sinusitis, unspecified: Secondary | ICD-10-CM

## 2023-11-09 MED ORDER — AMOXICILLIN-POT CLAVULANATE 875-125 MG PO TABS: 1.0000 | ORAL_TABLET | Freq: Two times a day (BID) | ORAL | 0 refills | Status: DC

## 2023-11-09 NOTE — Progress Notes (Signed)

## 2023-11-09 NOTE — Progress Notes (Signed)
 I have spent 5 minutes in review of e-visit questionnaire, review and updating patient chart, medical decision making and response to patient.   Elsie Velma Lunger, PA-C

## 2024-01-18 ENCOUNTER — Ambulatory Visit: Payer: Self-pay

## 2024-01-18 NOTE — Telephone Encounter (Signed)
 FYI Only or Action Required?: FYI only for provider: appointment scheduled on 11/13.  Patient was last seen in primary care on 06/14/2023 by Melvin Pao, NP.  Called Nurse Triage reporting Cough and Headache.  Symptoms began several days ago.  Interventions attempted: OTC medications: Mucinex , Sudafed, Tylenol, Aleve.  Symptoms are: unchanged.  Triage Disposition: See Physician Within 24 Hours  Patient/caregiver understands and will follow disposition?: Yes       Copied from CRM #[id number]. Topic: Clinical - Red Word Triage >> Jan 18, 2024  9:52 AM Charolett L wrote: [facility] that prompted transfer to Nurse Triage: congestion, greenish/yellowish mucus, cough, headache around eye area Reason for Disposition  [1] Continuous (nonstop) coughing interferes with work or school AND [2] no improvement using cough treatment per Care Advice  Answer Assessment - Initial Assessment Questions 1. ONSET: When did the cough begin?      X 5 days   2. SEVERITY: How bad is the cough today?      Intermittent   3. SPUTUM: Describe the color of your sputum (e.g., none, dry cough; clear, white, yellow, green)     Yellow/ green   4. HEMOPTYSIS: Are you coughing up any blood? If Yes, ask: How much? (e.g., flecks, streaks, tablespoons, etc.)     No   5. DIFFICULTY BREATHING: Are you having difficulty breathing? If Yes, ask: How bad is it? (e.g., mild, moderate, severe)      No   6. FEVER: Do you have a fever? If Yes, ask: What is your temperature, how was it measured, and when did it start?     Low grade fever   7. CARDIAC HISTORY: Do you have any history of heart disease? (e.g., heart attack, congestive heart failure)      No   8. LUNG HISTORY: Do you have any history of lung disease?  (e.g., pulmonary embolus, asthma, emphysema)      No   10. OTHER SYMPTOMS: Do you have any other symptoms? (e.g., runny nose, wheezing, chest pain)  Nasal congestion, headache  around eye area noted. For home care patient has taken Mucinex , Sudafed, Tylenol, Aleve. Appointment scheduled for evaluation. Patient agrees with plan of care, and will call back if anything changes, or if symptoms worsen.  Protocols used: Cough - Acute Productive-A-AH

## 2024-01-19 ENCOUNTER — Encounter: Payer: Self-pay | Admitting: Nurse Practitioner

## 2024-01-19 ENCOUNTER — Ambulatory Visit: Admitting: Nurse Practitioner

## 2024-01-19 VITALS — BP 123/75 | HR 93 | Temp 98.4°F | Resp 17 | Ht 65.51 in | Wt 119.4 lb

## 2024-01-19 DIAGNOSIS — J329 Chronic sinusitis, unspecified: Secondary | ICD-10-CM

## 2024-01-19 MED ORDER — AMOXICILLIN 500 MG PO CAPS: 500.0000 mg | ORAL_CAPSULE | Freq: Two times a day (BID) | ORAL | 0 refills | Status: AC

## 2024-01-19 NOTE — Progress Notes (Signed)
 BP 123/75 (BP Location: Left Arm, Patient Position: Sitting, Cuff Size: Normal)   Pulse 93   Temp 98.4 F (36.9 C) (Oral)   Resp 17   Ht 5' 5.51 (1.664 m)   Wt 119 lb 6.4 oz (54.2 kg)   SpO2 97%   BMI 19.56 kg/m    Subjective:    Patient ID: Jacqueline Salinas, female    DOB: March 26, 1970, 53 y.o.   MRN: [id number]  HPI: Jacqueline Salinas is a 52 y.o. female  Chief Complaint  Patient presents with   Sinus Problem    Congestion, headache, coughing, post nasal drip, low grade fever, sinus pain/pressure. Started about a week ago.    UPPER RESPIRATORY TRACT INFECTION Worst symptom: symptoms started about a week ago.  Fever: 99 Cough: yes Shortness of breath: no Wheezing: no Chest pain: no Chest tightness: no Chest congestion: no Nasal congestion: yes Runny nose: yes Post nasal drip: yes Sneezing: no Sore throat: yes Swollen glands: no Sinus pressure: yes Headache: yes Face pain: yes Toothache: no Ear pain: no bilateral Ear pressure: no bilateral Eyes red/itching:no Eye drainage/crusting: no  Vomiting: no Rash: no Fatigue: yes Sick contacts: no Strep contacts: no  Context: better Recurrent sinusitis: no Relief with OTC cold/cough medications: yes Treatments attempted: pseudoephedrine , flonase   Relevant past medical, surgical, family and social history reviewed and updated as indicated. Interim medical history since our last visit reviewed. Allergies and medications reviewed and updated.  Review of Systems  Constitutional:  Positive for fatigue and fever.  HENT:  Positive for congestion, postnasal drip, rhinorrhea, sinus pressure, sinus pain and sore throat. Negative for dental problem, ear pain and sneezing.   Respiratory:  Positive for cough. Negative for shortness of breath and wheezing.   Cardiovascular:  Negative for chest pain.  Gastrointestinal:  Negative for vomiting.  Skin:  Negative for rash.  Neurological:  Positive for headaches.    Per HPI unless  specifically indicated above     Objective:    BP 123/75 (BP Location: Left Arm, Patient Position: Sitting, Cuff Size: Normal)   Pulse 93   Temp 98.4 F (36.9 C) (Oral)   Resp 17   Ht 5' 5.51 (1.664 m)   Wt 119 lb 6.4 oz (54.2 kg)   SpO2 97%   BMI 19.56 kg/m   Wt Readings from Last 3 Encounters:  01/19/24 119 lb 6.4 oz (54.2 kg)  06/14/23 138 lb (62.6 kg)  12/23/22 141 lb 6.4 oz (64.1 kg)    Physical Exam Vitals and nursing note reviewed.  Constitutional:      General: She is not in acute distress.    Appearance: Normal appearance. She is normal weight. She is not ill-appearing, toxic-appearing or diaphoretic.  HENT:     Head: Normocephalic.     Right Ear: External ear normal. A middle ear effusion is present.     Left Ear: External ear normal. A middle ear effusion is present.     Nose: Congestion and rhinorrhea present.     Right Sinus: Frontal sinus tenderness present.     Left Sinus: Frontal sinus tenderness present.     Mouth/Throat:     Mouth: Mucous membranes are moist.     Pharynx: Oropharynx is clear. Posterior oropharyngeal erythema present.  Eyes:     General:        Right eye: No discharge.        Left eye: No discharge.     Extraocular Movements: Extraocular movements  intact.     Conjunctiva/sclera: Conjunctivae normal.     Pupils: Pupils are equal, round, and reactive to light.  Cardiovascular:     Rate and Rhythm: Normal rate and regular rhythm.     Heart sounds: No murmur heard. Pulmonary:     Effort: Pulmonary effort is normal. No respiratory distress.     Breath sounds: Normal breath sounds. No wheezing or rales.  Musculoskeletal:     Cervical back: Normal range of motion and neck supple.  Skin:    General: Skin is warm and dry.     Capillary Refill: Capillary refill takes less than 2 seconds.  Neurological:     General: No focal deficit present.     Mental Status: She is alert and oriented to person, place, and time. Mental status is at  baseline.  Psychiatric:        Mood and Affect: Mood normal.        Behavior: Behavior normal.        Thought Content: Thought content normal.        Judgment: Judgment normal.     Results for orders placed or performed in visit on 03/29/23  HM PAP SMEAR   Collection Time: 03/12/20 12:00 AM  Result Value Ref Range   HM Pap smear See Report in Chart       Assessment & Plan:   Problem List Items Addressed This Visit   None Visit Diagnoses       Recurrent sinusitis    -  Primary   Will treat with amoxicillin . This is 4th sinus infection since March.  Referral placed for ENT for evaluation and management. May need allergy testing.   Relevant Medications   amoxicillin  (AMOXIL ) 500 MG capsule   Other Relevant Orders   Ambulatory referral to ENT        Follow up plan: No follow-ups on file.

## 2024-02-04 ENCOUNTER — Other Ambulatory Visit: Payer: Self-pay | Admitting: Family Medicine

## 2024-02-08 NOTE — Telephone Encounter (Signed)
 Requested Prescriptions  Pending Prescriptions Disp Refills   amLODipine  (NORVASC ) 2.5 MG tablet [Pharmacy Med Name: AMLODIPINE  BESYLATE 2.5 MG TAB] 90 tablet 3    Sig: TAKE 1 TABLET BY MOUTH EVERY DAY     Cardiovascular: Calcium Channel Blockers 2 Failed - 02/08/2024 10:41 AM      Failed - Valid encounter within last 6 months    Recent Outpatient Visits           2 weeks ago Recurrent sinusitis   [facility] Melvin Pao, NP   7 months ago Acute non-recurrent frontal sinusitis   [facility] Melvin Pao, NP              Passed - Last BP in normal range    BP Readings from Last 1 Encounters:  01/19/24 123/75         Passed - Last Heart Rate in normal range    Pulse Readings from Last 1 Encounters:  01/19/24 93

## 2024-03-05 ENCOUNTER — Other Ambulatory Visit: Payer: Self-pay | Admitting: Family Medicine

## 2024-03-05 NOTE — Telephone Encounter (Unsigned)
 Copied from CRM [id number]. Topic: Clinical - Medication Refill >> Mar 05, 2024  3:40 PM Kevelyn M wrote: Medication: amphetamine-dextroamphetamine (ADDERALL) 20 MG tablet, clonazePAM (KLONOPIN) 0.5 MG tablet, (Gynecologist is no longer practicing and has retired)  Has the patient contacted their pharmacy? No, o refills (Agent: If no, request that the patient contact the pharmacy for the refill. If patient does not wish to contact the pharmacy document the reason why and proceed with request.) (Agent: If yes, when and what did the pharmacy advise?)  This is the patient's preferred pharmacy:  [id number] - [place], [place] - [address] [place] [address] Phone: [phone] Fax: [phone]  Is this the correct pharmacy for this prescription? Yes If no, delete pharmacy and type the correct one.   Has the prescription been filled recently? No  Is the patient out of the medication? Yes  Has the patient been seen for an appointment in the last year OR does the patient have an upcoming appointment? No  Can we respond through MyChart? Yes  Agent: Please be advised that Rx refills may take up to 3 business days. We ask that you follow-up with your pharmacy.

## 2024-03-06 ENCOUNTER — Telehealth: Admitting: Physician Assistant

## 2024-03-06 DIAGNOSIS — J329 Chronic sinusitis, unspecified: Secondary | ICD-10-CM | POA: Diagnosis not present

## 2024-03-07 MED ORDER — DOXYCYCLINE HYCLATE 100 MG PO TABS: 100.0000 mg | ORAL_TABLET | Freq: Two times a day (BID) | ORAL | 0 refills | Status: DC

## 2024-03-07 NOTE — Progress Notes (Signed)
 E-Visit for Sinus Problems  We are sorry that you are not feeling well.  Here is how we plan to help!  Based on what you have shared with me it looks like you have sinusitis.  Sinusitis is inflammation and infection in the sinus cavities of the head.  Based on your presentation I believe you most likely have Acute Bacterial Sinusitis.  This is an infection caused by bacteria and is treated with antibiotics. I have prescribed Doxycycline 100mg  by mouth twice a day for 7 days. You may use an oral decongestant such as Mucinex D or if you have glaucoma or high blood pressure use plain Mucinex. Saline nasal spray help and can safely be used as often as needed for congestion.  If you develop worsening sinus pain, fever or notice severe headache and vision changes, or if symptoms are not better after completion of antibiotic, please schedule an appointment with a health care provider.    Sinus infections are not as easily transmitted as other respiratory infection, however we still recommend that you avoid close contact with loved ones, especially the very young and elderly.  Remember to wash your hands thoroughly throughout the day as this is the number one way to prevent the spread of infection!  Home Care: Only take medications as instructed by your medical team. Complete the entire course of an antibiotic. Do not take these medications with alcohol. A steam or ultrasonic humidifier can help congestion.  You can place a towel over your head and breathe in the steam from hot water coming from a faucet. Avoid close contacts especially the very young and the elderly. Cover your mouth when you cough or sneeze. Always remember to wash your hands.  Get Help Right Away If: You develop worsening fever or sinus pain. You develop a severe head ache or visual changes. Your symptoms persist after you have completed your treatment plan.  Make sure you Understand these instructions. Will watch your  condition. Will get help right away if you are not doing well or get worse.  Your e-visit answers were reviewed by a board certified advanced clinical practitioner to complete your personal care plan.  Depending on the condition, your plan could have included both over the counter or prescription medications.  If there is a problem please reply  once you have received a response from your provider.  Your safety is important to us .  If you have drug allergies check your prescription carefully.    You can use MyChart to ask questions about today's visit, request a non-urgent call back, or ask for a work or school excuse for 24 hours related to this e-Visit. If it has been greater than 24 hours you will need to follow up with your provider, or enter a new e-Visit to address those concerns.  You will get an e-mail in the next two days asking about your experience.  I hope that your e-visit has been valuable and will speed your recovery. Thank you for using e-visits.  I have spent 5 minutes in review of e-visit questionnaire, review and updating patient chart, medical decision making and response to patient.   Delon CHRISTELLA Dickinson, PA-C

## 2024-03-07 NOTE — Telephone Encounter (Signed)
 Requested medication (s) are due for refill today: routing for review  Requested medication (s) are on the active medication list: no  Last refill:  12/31/14  Future visit scheduled: no  Notes to clinic:  Unable to refill per protocol, cannot delegate. Historical medication.      Requested Prescriptions  Pending Prescriptions Disp Refills   amphetamine-dextroamphetamine (ADDERALL XR) 30 MG 24 hr capsule  0     Not Delegated - Psychiatry:  Stimulants/ADHD Failed - 03/07/2024  8:22 AM      Failed - This refill cannot be delegated      Failed - Urine Drug Screen completed in last 360 days      Failed - Valid encounter within last 6 months    Recent Outpatient Visits           1 month ago Recurrent sinusitis   [facility] Melvin Pao, NP   8 months ago Acute non-recurrent frontal sinusitis   [facility] Melvin Pao, NP              Passed - Last BP in normal range    BP Readings from Last 1 Encounters:  01/19/24 123/75         Passed - Last Heart Rate in normal range    Pulse Readings from Last 1 Encounters:  01/19/24 93          clonazePAM (KLONOPIN) 0.5 MG tablet 30 tablet 0     Not Delegated - Psychiatry: Anxiolytics/Hypnotics 2 Failed - 03/07/2024  8:22 AM      Failed - This refill cannot be delegated      Failed - Urine Drug Screen completed in last 360 days      Failed - Valid encounter within last 6 months    Recent Outpatient Visits           1 month ago Recurrent sinusitis   [facility] Melvin Pao, NP   8 months ago Acute non-recurrent frontal sinusitis   [place] [facility] Melvin Pao, NP              Passed - Patient is not pregnant

## 2024-03-15 ENCOUNTER — Encounter: Payer: Self-pay | Admitting: Family Medicine

## 2024-03-15 ENCOUNTER — Ambulatory Visit: Admitting: Family Medicine

## 2024-03-15 VITALS — BP 138/78 | HR 102 | Temp 98.0°F | Ht 65.5 in | Wt 118.6 lb

## 2024-03-15 DIAGNOSIS — I10 Essential (primary) hypertension: Secondary | ICD-10-CM | POA: Diagnosis not present

## 2024-03-15 DIAGNOSIS — Z23 Encounter for immunization: Secondary | ICD-10-CM

## 2024-03-15 DIAGNOSIS — Z79899 Other long term (current) drug therapy: Secondary | ICD-10-CM | POA: Diagnosis not present

## 2024-03-15 DIAGNOSIS — F419 Anxiety disorder, unspecified: Secondary | ICD-10-CM | POA: Diagnosis not present

## 2024-03-15 DIAGNOSIS — F909 Attention-deficit hyperactivity disorder, unspecified type: Secondary | ICD-10-CM | POA: Diagnosis not present

## 2024-03-15 DIAGNOSIS — Z136 Encounter for screening for cardiovascular disorders: Secondary | ICD-10-CM

## 2024-03-15 DIAGNOSIS — Z789 Other specified health status: Secondary | ICD-10-CM | POA: Diagnosis not present

## 2024-03-15 LAB — MICROALBUMIN, URINE WAIVED
Creatinine, Urine Waived: 50 mg/dL (ref 10–300)
Microalb, Ur Waived: 10 mg/L (ref 0–19)

## 2024-03-15 MED ORDER — BUPROPION HCL ER (SR) 100 MG PO TB12: 100.0000 mg | ORAL_TABLET | Freq: Two times a day (BID) | ORAL | 0 refills | Status: AC

## 2024-03-15 MED ORDER — AMPHETAMINE-DEXTROAMPHET ER 30 MG PO CP24: 30.0000 mg | ORAL_CAPSULE | Freq: Every day | ORAL | 0 refills | Status: AC

## 2024-03-15 MED ORDER — AMPHETAMINE-DEXTROAMPHETAMINE 20 MG PO TABS: 20.0000 mg | ORAL_TABLET | Freq: Every day | ORAL | 0 refills | Status: AC

## 2024-03-15 MED ORDER — SERTRALINE HCL 25 MG PO TABS: ORAL_TABLET | ORAL | 1 refills | Status: AC

## 2024-03-15 MED ORDER — CLONAZEPAM 0.5 MG PO TABS: 0.5000 mg | ORAL_TABLET | Freq: Every evening | ORAL | 0 refills | Status: AC | PRN
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
## 2024-03-16 LAB — COMPREHENSIVE METABOLIC PANEL WITH GFR
ALT: 30 IU/L (ref 0–32)
AST: 34 IU/L (ref 0–40)
Albumin: 4.7 g/dL (ref 3.8–4.9)
Alkaline Phosphatase: 58 IU/L (ref 49–135)
BUN/Creatinine Ratio: 35 — ABNORMAL HIGH (ref 9–23)
BUN: 29 mg/dL — ABNORMAL HIGH (ref 6–24)
Bilirubin Total: <0.2 mg/dL (ref 0.0–1.2)
CO2: 24 mmol/L (ref 20–29)
Calcium: 9.5 mg/dL (ref 8.7–10.2)
Chloride: 94 mmol/L — ABNORMAL LOW (ref 96–106)
Creatinine, Ser: 0.84 mg/dL (ref 0.57–1.00)
Globulin, Total: 2.1 g/dL (ref 1.5–4.5)
Glucose: 82 mg/dL (ref 70–99)
Potassium: 4.1 mmol/L (ref 3.5–5.2)
Sodium: 136 mmol/L (ref 134–144)
Total Protein: 6.8 g/dL (ref 6.0–8.5)
eGFR: 84 mL/min/1.73

## 2024-03-16 LAB — CBC WITH DIFFERENTIAL/PLATELET
Basophils Absolute: 0.1 x10E3/uL (ref 0.0–0.2)
Basos: 1 %
EOS (ABSOLUTE): 0.2 x10E3/uL (ref 0.0–0.4)
Eos: 3 %
Hematocrit: 38.6 % (ref 34.0–46.6)
Hemoglobin: 12.8 g/dL (ref 11.1–15.9)
Immature Grans (Abs): 0 x10E3/uL (ref 0.0–0.1)
Immature Granulocytes: 0 %
Lymphocytes Absolute: 2.6 x10E3/uL (ref 0.7–3.1)
Lymphs: 33 %
MCH: 32.7 pg (ref 26.6–33.0)
MCHC: 33.2 g/dL (ref 31.5–35.7)
MCV: 99 fL — ABNORMAL HIGH (ref 79–97)
Monocytes Absolute: 0.6 x10E3/uL (ref 0.1–0.9)
Monocytes: 7 %
Neutrophils Absolute: 4.4 x10E3/uL (ref 1.4–7.0)
Neutrophils: 56 %
Platelets: 407 x10E3/uL (ref 150–450)
RBC: 3.91 x10E6/uL (ref 3.77–5.28)
RDW: 12.8 % (ref 11.7–15.4)
WBC: 8 x10E3/uL (ref 3.4–10.8)

## 2024-03-16 LAB — HEPATITIS B SURFACE ANTIBODY, QUANTITATIVE: Hepatitis B Surf Ab Quant: <3.5 m[IU]/mL — ABNORMAL LOW

## 2024-03-16 LAB — LIPID PANEL W/O CHOL/HDL RATIO
Cholesterol, Total: 217 mg/dL — ABNORMAL HIGH (ref 100–199)
HDL: 98 mg/dL
LDL Chol Calc (NIH): 102 mg/dL — ABNORMAL HIGH (ref 0–99)
Triglycerides: 102 mg/dL (ref 0–149)
VLDL Cholesterol Cal: 17 mg/dL (ref 5–40)

## 2024-03-16 LAB — TSH: TSH: 1.84 u[IU]/mL (ref 0.450–4.500)

## 2024-03-19 ENCOUNTER — Ambulatory Visit: Payer: Self-pay | Admitting: Family Medicine

## 2024-03-25 DIAGNOSIS — I10 Essential (primary) hypertension: Secondary | ICD-10-CM | POA: Insufficient documentation

## 2024-03-25 DIAGNOSIS — Z79899 Other long term (current) drug therapy: Secondary | ICD-10-CM | POA: Insufficient documentation

## 2024-03-25 NOTE — Assessment & Plan Note (Addendum)
Under good control on current regimen. Continue current regimen. Continue to monitor. Call with any concerns. Refills given. Labs drawn today. Await results.  

## 2024-03-25 NOTE — Assessment & Plan Note (Signed)
Under good control on current regimen. Continue current regimen. Continue to monitor. Call with any concerns. Refills given. Follow up 1 month.

## 2024-03-25 NOTE — Assessment & Plan Note (Signed)
 Willing to add a preventative medicine in. Start zoloft . Continue wellbutrin  and klonopin . Recheck in about a month. Call with any concerns.

## 2024-04-09 ENCOUNTER — Other Ambulatory Visit: Payer: Self-pay | Admitting: Family Medicine

## 2024-04-10 NOTE — Telephone Encounter (Signed)
 Requested Prescriptions  Refused Prescriptions Disp Refills   sertraline  (ZOLOFT ) 25 MG tablet [Pharmacy Med Name: SERTRALINE  HCL 25 MG TABLET] 90 tablet 1    Sig: TAKE 1/2 PILL DAILY FOR 1 WEEK, THEN INCREASE TO 1 PILL DAILY     Psychiatry:  Antidepressants - SSRI - sertraline  Passed - 04/10/2024  4:21 PM      Passed - AST in normal range and within 360 days    AST  Date Value Ref Range Status  03/15/2024 34 0 - 40 IU/L Final   SGOT(AST)  Date Value Ref Range Status  08/02/2013 30 15 - 37 Unit/L Final         Passed - ALT in normal range and within 360 days    ALT  Date Value Ref Range Status  03/15/2024 30 0 - 32 IU/L Final   SGPT (ALT)  Date Value Ref Range Status  08/02/2013 19 12 - 78 U/L Final         Passed - Completed PHQ-2 or PHQ-9 in the last 360 days      Passed - Valid encounter within last 6 months    Recent Outpatient Visits           3 weeks ago Attention deficit hyperactivity disorder (ADHD), unspecified ADHD type   [facility] [place], Megan P, DO   2 months ago Recurrent sinusitis   [facility] Melvin Pao, NP   10 months ago Acute non-recurrent frontal sinusitis   [facility] Melvin Pao, NP

## 2024-04-26 ENCOUNTER — Encounter: Admitting: Family Medicine
